# Patient Record
Sex: Male | Born: 1935 | Race: White | Hispanic: No | Marital: Married | Smoking: Never smoker
Health system: Southern US, Community
[De-identification: ages and names within clinical notes are randomized; demographics above are authoritative.]

## PROBLEM LIST (undated history)

## (undated) DIAGNOSIS — C801 Malignant (primary) neoplasm, unspecified: Secondary | ICD-10-CM

## (undated) DIAGNOSIS — F0390 Unspecified dementia without behavioral disturbance: Secondary | ICD-10-CM

## (undated) DIAGNOSIS — I10 Essential (primary) hypertension: Secondary | ICD-10-CM

---

## 2004-08-10 ENCOUNTER — Ambulatory Visit: Payer: Self-pay | Admitting: Internal Medicine

## 2004-09-06 ENCOUNTER — Ambulatory Visit: Payer: Self-pay | Admitting: Internal Medicine

## 2004-10-12 ENCOUNTER — Ambulatory Visit: Payer: Self-pay | Admitting: Internal Medicine

## 2004-11-04 ENCOUNTER — Ambulatory Visit: Payer: Self-pay | Admitting: Internal Medicine

## 2007-07-18 ENCOUNTER — Ambulatory Visit: Payer: Self-pay | Admitting: Vascular Surgery

## 2007-07-23 ENCOUNTER — Other Ambulatory Visit: Payer: Self-pay

## 2007-07-23 ENCOUNTER — Ambulatory Visit: Payer: Self-pay | Admitting: Vascular Surgery

## 2007-07-28 ENCOUNTER — Inpatient Hospital Stay: Payer: Self-pay | Admitting: Vascular Surgery

## 2007-09-12 ENCOUNTER — Ambulatory Visit: Payer: Self-pay | Admitting: Vascular Surgery

## 2009-02-25 ENCOUNTER — Ambulatory Visit: Payer: Self-pay | Admitting: Internal Medicine

## 2011-05-29 ENCOUNTER — Ambulatory Visit: Payer: Self-pay | Admitting: Vascular Surgery

## 2011-08-16 ENCOUNTER — Ambulatory Visit: Payer: Self-pay | Admitting: Vascular Surgery

## 2011-08-16 LAB — CBC
HCT: 43.4 % (ref 40.0–52.0)
HGB: 14.6 g/dL (ref 13.0–18.0)
MCHC: 33.6 g/dL (ref 32.0–36.0)
MCV: 92 fL (ref 80–100)
Platelet: 241 10*3/uL (ref 150–440)
RBC: 4.72 10*6/uL (ref 4.40–5.90)
WBC: 6.6 10*3/uL (ref 3.8–10.6)

## 2011-08-16 LAB — BASIC METABOLIC PANEL
Anion Gap: 6 — ABNORMAL LOW (ref 7–16)
Calcium, Total: 9.2 mg/dL (ref 8.5–10.1)
Co2: 31 mmol/L (ref 21–32)
Creatinine: 0.97 mg/dL (ref 0.60–1.30)
EGFR (African American): >60
Sodium: 141 mmol/L (ref 136–145)

## 2011-08-28 ENCOUNTER — Ambulatory Visit: Payer: Self-pay | Admitting: Cardiology

## 2011-08-29 LAB — BASIC METABOLIC PANEL
Anion Gap: 11 (ref 7–16)
Calcium, Total: 8.4 mg/dL — ABNORMAL LOW (ref 8.5–10.1)
Co2: 28 mmol/L (ref 21–32)
Creatinine: 0.85 mg/dL (ref 0.60–1.30)
EGFR (African American): >60
Glucose: 128 mg/dL — ABNORMAL HIGH (ref 65–99)

## 2011-09-26 ENCOUNTER — Encounter: Payer: Self-pay | Admitting: Cardiology

## 2011-10-05 ENCOUNTER — Encounter: Payer: Self-pay | Admitting: Cardiology

## 2011-11-05 ENCOUNTER — Encounter: Payer: Self-pay | Admitting: Cardiology

## 2012-03-28 ENCOUNTER — Emergency Department: Payer: Self-pay | Admitting: Emergency Medicine

## 2012-03-28 LAB — BASIC METABOLIC PANEL
Anion Gap: 9 (ref 7–16)
BUN: 20 mg/dL — ABNORMAL HIGH (ref 7–18)
Chloride: 102 mmol/L (ref 98–107)
Creatinine: 0.89 mg/dL (ref 0.60–1.30)
EGFR (African American): >60
Glucose: 107 mg/dL — ABNORMAL HIGH (ref 65–99)
Osmolality: 282 (ref 275–301)

## 2012-03-28 LAB — CBC WITH DIFFERENTIAL/PLATELET
Eosinophil #: 0.1 10*3/uL (ref 0.0–0.7)
Eosinophil %: 1.2 %
HCT: 42.4 % (ref 40.0–52.0)
HGB: 14.1 g/dL (ref 13.0–18.0)
Lymphocyte %: 28.3 %
MCHC: 33.2 g/dL (ref 32.0–36.0)
Monocyte #: 0.6 x10 3/mm (ref 0.2–1.0)
Monocyte %: 8.4 %
Neutrophil %: 61.3 %
Platelet: 206 10*3/uL (ref 150–440)
RBC: 4.68 10*6/uL (ref 4.40–5.90)
WBC: 7.4 10*3/uL (ref 3.8–10.6)

## 2012-08-28 ENCOUNTER — Ambulatory Visit: Payer: Self-pay | Admitting: Internal Medicine

## 2013-03-27 ENCOUNTER — Emergency Department: Payer: Self-pay | Admitting: Unknown Physician Specialty

## 2013-03-27 LAB — CBC
HGB: 14.1 g/dL (ref 13.0–18.0)
MCH: 30.9 pg (ref 26.0–34.0)
MCV: 92 fL (ref 80–100)
Platelet: 228 10*3/uL (ref 150–440)
RDW: 13.7 % (ref 11.5–14.5)
WBC: 8.5 10*3/uL (ref 3.8–10.6)

## 2013-03-27 LAB — COMPREHENSIVE METABOLIC PANEL
Albumin: 4.2 g/dL (ref 3.4–5.0)
Anion Gap: 1 — ABNORMAL LOW (ref 7–16)
Bilirubin,Total: 0.7 mg/dL (ref 0.2–1.0)
Calcium, Total: 9.1 mg/dL (ref 8.5–10.1)
Co2: 32 mmol/L (ref 21–32)
EGFR (Non-African Amer.): >60
Glucose: 117 mg/dL — ABNORMAL HIGH (ref 65–99)
Osmolality: 271 (ref 275–301)
Potassium: 4.1 mmol/L (ref 3.5–5.1)
SGOT(AST): 24 U/L (ref 15–37)
SGPT (ALT): 34 U/L (ref 12–78)
Sodium: 134 mmol/L — ABNORMAL LOW (ref 136–145)
Total Protein: 7.5 g/dL (ref 6.4–8.2)

## 2013-03-27 LAB — TROPONIN I: Troponin-I: <0.02 ng/mL

## 2013-03-27 LAB — MAGNESIUM: Magnesium: 2 mg/dL

## 2013-04-21 ENCOUNTER — Ambulatory Visit: Payer: Self-pay | Admitting: Cardiology

## 2013-12-11 LAB — CBC
HCT: 42.3 % (ref 40.0–52.0)
HGB: 13.8 g/dL (ref 13.0–18.0)
MCH: 29.9 pg (ref 26.0–34.0)
MCHC: 32.7 g/dL (ref 32.0–36.0)
MCV: 91 fL (ref 80–100)
PLATELETS: 224 10*3/uL (ref 150–440)
RBC: 4.63 10*6/uL (ref 4.40–5.90)
RDW: 13.6 % (ref 11.5–14.5)
WBC: 11 10*3/uL — AB (ref 3.8–10.6)

## 2013-12-11 LAB — BASIC METABOLIC PANEL
ANION GAP: 10 (ref 7–16)
BUN: 33 mg/dL — AB (ref 7–18)
CALCIUM: 9.4 mg/dL (ref 8.5–10.1)
CO2: 23 mmol/L (ref 21–32)
Chloride: 103 mmol/L (ref 98–107)
Creatinine: 1.96 mg/dL — ABNORMAL HIGH (ref 0.60–1.30)
EGFR (African American): 36 — ABNORMAL LOW
GFR CALC NON AF AMER: 31 — AB
GLUCOSE: 125 mg/dL — AB (ref 65–99)
Osmolality: 281 (ref 275–301)
POTASSIUM: 3.9 mmol/L (ref 3.5–5.1)
Sodium: 136 mmol/L (ref 136–145)

## 2013-12-11 LAB — CK TOTAL AND CKMB (NOT AT ARMC)
CK, Total: 74 U/L
CK-MB: 0.6 ng/mL (ref 0.5–3.6)

## 2013-12-11 LAB — TROPONIN I

## 2013-12-12 ENCOUNTER — Inpatient Hospital Stay: Payer: Self-pay | Admitting: Student

## 2013-12-12 LAB — BASIC METABOLIC PANEL
ANION GAP: 6 — AB (ref 7–16)
BUN: 28 mg/dL — AB (ref 7–18)
CALCIUM: 8.8 mg/dL (ref 8.5–10.1)
CHLORIDE: 104 mmol/L (ref 98–107)
CO2: 28 mmol/L (ref 21–32)
Creatinine: 1.08 mg/dL (ref 0.60–1.30)
EGFR (African American): >60
GLUCOSE: 104 mg/dL — AB (ref 65–99)
OSMOLALITY: 281 (ref 275–301)
Potassium: 3.5 mmol/L (ref 3.5–5.1)
Sodium: 138 mmol/L (ref 136–145)

## 2013-12-12 LAB — URINALYSIS, COMPLETE
Bilirubin,UR: NEGATIVE
Blood: NEGATIVE
Glucose,UR: NEGATIVE mg/dL (ref 0–75)
LEUKOCYTE ESTERASE: NEGATIVE
Nitrite: NEGATIVE
PROTEIN: NEGATIVE
Ph: 5 (ref 4.5–8.0)
SPECIFIC GRAVITY: 1.015 (ref 1.003–1.030)
Squamous Epithelial: <1

## 2013-12-12 LAB — SODIUM, URINE, RANDOM: Sodium, Urine Random: 131 mmol/L (ref 20–110)

## 2013-12-12 LAB — TSH: THYROID STIMULATING HORM: 2.94 u[IU]/mL

## 2013-12-12 LAB — CREATININE, URINE, RANDOM: CREATININE, URINE RANDOM: 96.9 mg/dL (ref 30.0–125.0)

## 2014-05-18 ENCOUNTER — Ambulatory Visit (INDEPENDENT_AMBULATORY_CARE_PROVIDER_SITE_OTHER): Payer: Medicare Other | Admitting: Podiatry

## 2014-05-18 ENCOUNTER — Encounter: Payer: Self-pay | Admitting: Podiatry

## 2014-05-18 VITALS — BP 155/82 | HR 70 | Resp 16 | Ht 71.0 in | Wt 215.0 lb

## 2014-05-18 DIAGNOSIS — B351 Tinea unguium: Secondary | ICD-10-CM

## 2014-05-18 DIAGNOSIS — M79673 Pain in unspecified foot: Secondary | ICD-10-CM

## 2014-05-18 DIAGNOSIS — M205X9 Other deformities of toe(s) (acquired), unspecified foot: Secondary | ICD-10-CM

## 2014-05-18 DIAGNOSIS — M21629 Bunionette of unspecified foot: Secondary | ICD-10-CM

## 2014-05-18 DIAGNOSIS — Q828 Other specified congenital malformations of skin: Secondary | ICD-10-CM

## 2014-05-18 MED ORDER — TAVABOROLE 5 % EX SOLN: 1.0000 [drp] | CUTANEOUS | Status: DC

## 2014-05-18 NOTE — Patient Instructions (Signed)

## 2014-05-19 ENCOUNTER — Encounter: Payer: Self-pay | Admitting: Podiatry

## 2014-05-19 NOTE — Progress Notes (Signed)
Patient ID: Charles Velasquez, male   DOB: [birth year], 80 y.o.   MRN: [id number]  Subjective: 80 year old male presents the office today for complaints of bilateral big toe nail thickening as well as a history of painful areas on the outside aspect of his feet bilaterally. Patient is accompanied today with his daughter. Patient's daughter states that previously he had 2 areas on the outside of his feet which became callus-like and appear to have some fluid underneath it. At that time she states that it was red and very tender on both feet. Since then patient's purchase new shoes which has seemed to help alleviate the pressure off of the symptomatic areas. Today they're inquiring about possible nail fungus, shoe gear, weighs to prevent recurrence the symptomatic areas. Denies a history of trauma or injury to the area. No other complaints at this time.  Objective: AAO x3, NAD DP/PT pulses palpable bilaterally, CRT less than 3 seconds Protective sensation intact with Simms Weinstein monofilament, Achilles tendon reflex intact Bilateral hallux nails hypertrophic, dystrophic, elongated, brittle, yellow discoloration. No swelling erythema or drainage. Bilateral tailors bunion deformity.  Hyperkeratotic lesion right foot lateral aspect on the fifth metatarsal head. Subjectively the patient's daughter states this is the area on both feet that previously had some fluid with callus like skin and are very painful. Today there is no overlying erythema, drainage, open wounds. There is tenderness to palpation directly over the hyperkeratotic lesion on the right foot. There is no pinpoint bony tenderness or pain with vibratory sensation. MMT 5/5, ROM WNL No calf pain, swelling, warmth.  Assessment: 80 year old male with onychomycosis bilateral hallux, prominent fifth metatarsal head with a right hyperkeratotic lesion overlying.  Plan: -Various treatment options were discussed the patient including alternatives,  risks, complications. -Hyperkeratotic lesion right foot sharply debrided without complications. -Discussed with the patient and his daughter that patient most likely benefit from a wider shoe given his tailors bunion deformity. Patient's daughter states that he previously was wearing are shoes. Also recommend offloading pads to the fifth metatarsal heads bilaterally to help alleviate the pressure. -For bilateral hallux nails discuss this is most likely onychomycosis due to the thickening, brittleness, yellow discoloration. They're inquiring about. Treatment options. I discussed with them different options including risks and complications. They've elected to proceed with [facility] for treatment. Side effects discussed and directed to stop the medication immediately if any are to occur call the office. A prescription for this was sent to [facility] and they were given information to followup with them. -Followup as needed. Call the office with any questions, concerns, change in symptoms. Discussed with the patient I can periodically debrided his calluses and  Symptomatic hallux nails.

## 2014-10-21 ENCOUNTER — Ambulatory Visit (INDEPENDENT_AMBULATORY_CARE_PROVIDER_SITE_OTHER): Payer: Medicare Other | Admitting: Podiatry

## 2014-10-21 DIAGNOSIS — B351 Tinea unguium: Secondary | ICD-10-CM

## 2014-10-21 DIAGNOSIS — Q828 Other specified congenital malformations of skin: Secondary | ICD-10-CM

## 2014-10-21 DIAGNOSIS — M79676 Pain in unspecified toe(s): Secondary | ICD-10-CM

## 2014-10-21 NOTE — Progress Notes (Signed)
Patient ID: Charles Velasquez, male   DOB: 1928-02-01, 81 y.o.   MRN: [id number]  Subjective: 81 y.o.-year-old male returns the office today for painful, elongated, thickened toenails. Denies any redness or drainage around the nails. He states he is unable to trim the nails himself. Also states he is a painful callus on the side of the right foot. Denies any redness or drainage from the site. He has since stopped using the [place] as he has run out. Denies any acute changes since last appointment and no new complaints today. Denies any systemic complaints such as fevers, chills, nausea, vomiting.   Objective: AAO 3, NAD DP/PT pulses palpable, CRT less than 3 seconds Protective sensation intact with Simms Weinstein monofilament, Achilles tendon reflex intact.  Nails hypertrophic, dystrophic, elongated, brittle, discolored 10. There is tenderness overlying the nails 1-5 bilaterally. There is no surrounding erythema or drainage along the nail sites. No open lesions bilaterally. There is a hyperkeratotic lesion on the lateral aspect of the right fifth metatarsal head. Upon debridement no underlying ulceration, drainage or other clinical signs of infection. No other areas of tenderness bilateral lower extremities. No overlying edema, erythema, increased warmth. No pain with calf compression, swelling, warmth, erythema.  Assessment: Patient presents with symptomatic onychomycosis  Plan: -Treatment options including alternatives, risks, complications were discussed -Nails sharply debrided 10 without complication/bleeding. -Hyperkeratotic lesion sharply debrided 1 without complications/bleeding. -I discussed to continue using Kerydin. There unsure if they want to continue with that. Discussed with him that if he wants treatment to call the office and I will refill the Kerydin. -Discussed daily foot inspection. If there are any changes, to call the office immediately.  -Follow-up in 3 months or sooner  if any problems are to arise. In the meantime, encouraged to call the office with any questions, concerns, changes symptoms.

## 2014-11-27 ENCOUNTER — Emergency Department
Admit: 2014-11-27 | Disposition: A | Discharge status: Home / Self Care | Payer: Self-pay | Admitting: Emergency Medicine

## 2014-11-27 LAB — CBC WITH DIFFERENTIAL/PLATELET
BASOS ABS: 0.1 10*3/uL (ref 0.0–0.1)
BASOS PCT: 0.7 %
EOS ABS: 0.1 10*3/uL (ref 0.0–0.7)
Eosinophil %: 0.8 %
HCT: 43.5 % (ref 40.0–52.0)
HGB: 14.5 g/dL (ref 13.0–18.0)
LYMPHS PCT: 15.4 %
Lymphocyte #: 1.5 10*3/uL (ref 1.0–3.6)
MCH: 29.9 pg (ref 26.0–34.0)
MCHC: 33.4 g/dL (ref 32.0–36.0)
MCV: 90 fL (ref 80–100)
Monocyte #: 0.6 x10 3/mm (ref 0.2–1.0)
Monocyte %: 6.6 %
NEUTROS ABS: 7.4 10*3/uL — AB (ref 1.4–6.5)
NEUTROS PCT: 76.5 %
Platelet: 247 10*3/uL (ref 150–440)
RBC: 4.85 10*6/uL (ref 4.40–5.90)
RDW: 13.5 % (ref 11.5–14.5)
WBC: 9.7 10*3/uL (ref 3.8–10.6)

## 2014-11-27 LAB — SEDIMENTATION RATE: ERYTHROCYTE SED RATE: 7 mm/h (ref 0–20)

## 2014-11-27 NOTE — Discharge Summary (Signed)
PATIENT NAME:  Charles Velasquez, Charles Velasquez MR#:  [id number] DATE OF BIRTH:  1927/12/11  DATE OF ADMISSION:  12/12/2013 DATE OF DISCHARGE:  12/12/2013  PRIMARY CARE PHYSICIAN: Dr. Baldemar Lenis.   CHIEF COMPLAINT: Presyncope.   DISCHARGE DIAGNOSES:  1. Acute kidney injury resolved.  2. Presyncope, likely situational.  3. Hypertension. 4. Gastroesophageal reflux disease.  5. History of coronary artery disease status post stent.  6. Hyperlipidemia.  7. Peripheral vascular disease.  8. History of transient ischemic attack.   DISCHARGE MEDICATIONS: Niaspan extended-release 1000 mg at bedtime, diltiazem 240 mg extended-release once a day, clopidogrel 75 mg daily, Prilosec OTC 20 mg once a day, atorvastatin 40 mg once a day, aspirin 81 mg once a day, hydrochlorothiazide/lisinopril 25/20 mg once a day.   DIET: Low sodium, low fat, low cholesterol.   ACTIVITY: As tolerated.   FOLLOWUP: Please follow with PCP for a BMP check and a blood pressure check within a week.   DISPOSITION: Home.   CODE STATUS: The patient is full code.   SIGNIFICANT LABS AND IMAGING: Initial BUN 33, creatinine 1.96, last creatinine of 1.0. TSH of 2.94. Troponin negative on admission. White count 11. UA did not suggest an infection.   HISTORY OF PRESENT ILLNESS AND HOSPITAL COURSE: For full details of H and P, please see the dictation on 05/09 by Dr. Lavetta Nielsen, but briefly, this is a pleasant 81 year old with history of CAD, hypertension, who was doing well. He was doing lawn work for about 3 hours yesterday and, per family and patient was keeping himself hydrated with Diet Cokes. He then had an event and attended at [facility] for about an hour where he was standing and felt flushed, lightheaded, and had a near syncopal episode. There was no loss of consciousness. He came into the hospital and was noted to have acute kidney injury with elevated BUN and creatinine. His blood pressure was stable. He was started on some IV fluids and  admitted to the hospitalist service. His lisinopril/hydrochlorothiazide was held. With IV hydration he felt significantly improved. His blood pressure actually was on the higher side, and he is not hypertensive nor does he have any significant orthostatic hypotension. I suspect his dizziness and presyncope is likely situational doing lawn work for multiple hours on a hot day and then standing for about an hour. He did develop an acute kidney injury which has resolved with IV fluids. He will need to follow up with his PCP for a BMP check within a week, and also check his blood pressure. At this point, he has no significant shortness of breath, chest pains, or palpitations.   PHYSICAL EXAMINATION:  VITAL SIGNS: On the day of discharge, his temperature is 98.5, pulse rate 64, respiratory rate 18, blood pressure 152/78, oxygen saturation 94%.  GENERAL: The patient well-developed male sitting in bed, no obvious distress.  HEENT: Normocephalic, atraumatic, moist mucous membranes.  HEART: Normal S1, S2.  LUNGS: Clear.  ABDOMEN: Soft, nontender.   TOTAL TIME SPENT: About 35 minutes.     ____________________________ Vivien Presto, MD sa:lt D: 12/12/2013 13:03:41 ET T: 12/12/2013 22:29:34 ET JOB#: [id number]  cc: Vivien Presto, MD, <Dictator> Derinda Late, MD Vivien Presto MD ELECTRONICALLY SIGNED 12/29/2013 18:29

## 2014-11-27 NOTE — H&P (Signed)
PATIENT NAME:  Charles Velasquez, Charles Velasquez MR#:  [id number] DATE OF BIRTH:  1927-09-28  DATE OF ADMISSION:  12/12/2013  REFERRING PHYSICIAN: Dr. Jacqualine Code.  PRIMARY CARE PHYSICIAN: Dr. Baldemar Lenis.  CHIEF COMPLAINT: Nearly passed out.  HISTORY OF PRESENT ILLNESS: An 81 year old history Caucasian male with a history of coronary artery disease, hypertension, presenting after a near-syncopal episode. He was in the usual state of health at an event at [facility] for about 3 hours outside, felt flushed as well as lightheaded and had near syncopal event. No loss of consciousness. No head trauma. He lay down, someone put a cold, wet towel on him. Symptoms improved. Denies any palpitations, chest pain, shortness of breath, or any further symptomatology. He is back to his usual state of health now. He denies any recent decrease in p.o. intake any nausea, vomiting, or any other changes.   REVIEW OF SYSTEMS:  CONSTITUTIONAL: Denies fever, fatigue, weakness. EYES: Denies blurry, double vision, or eye pain.  HEENT: Denies tinnitus, ear pain, hearing loss.  RESPIRATORY: Denies cough, wheeze, shortness of breath. CARDIOVASCULAR: Denies chest pain, palpitations, edema. GASTROINTESTINAL: Positive for nausea, denies vomiting, diarrhea, abdominal pain.  GENITOURINARY: Denies dysuria or hematuria.  ENDOCRINE: Denies nocturia or thyroid problems.  HEMATOLOGIC AND LYMPHATIC: Denies easy bruising, bleeding. SKIN: Denies rash or lesions. MUSCULOSKELETAL: Denies pain in neck, back, shoulders, knees, hips, or arthritic symptoms.  NEUROLOGIC: He denies paralysis or paresthesias.  PSYCHIATRIC: Denies anxiety or depressive symptoms.   Otherwise, full review of systems performed and is negative.   PAST MEDICAL HISTORY: Coronary artery disease status post PCI with stent placement, hyperlipidemia, peripheral vascular disease, gastroesophageal reflux disease, history of TIA, hypertension.   SOCIAL HISTORY: Remote tobacco use.  Occasional alcohol use. Denies any drug usage.   FAMILY HISTORY: No known cardiovascular or pulmonary illnesses.  ALLERGIES: VIAGRA.   HOME MEDICATIONS: Include aspirin 81 mg p.o. daily, diltiazem 240 mg p.o. daily, atorvastatin 40 mg p.o. daily, Niaspan extended release 1000 mg p.o. at bedtime, hydrochlorothiazide/lisinopril 25/20 mg p.o. daily, Plavix 75 mg p.o. at bedtime, Prilosec 20 mg p.o. daily.   PHYSICAL EXAMINATION:  VITAL SIGNS: Temperature 97.7, heart rate 66, respirations 21, blood pressure 139/54, saturating 95% on room air.  GENERAL: Well-nourished, well-developed Caucasian male currently in no acute distress.  HEAD: Normocephalic, atraumatic.  EYES: Pupils equal, round, reactive to light. Extraocular muscles intact. No scleral icterus.  MOUTH: Moist mucous membranes.  Dentition intake. No abscess noted.  EARS, NOSE, AND THROAT: Clear without exudates. No external lesions.  NECK: Supple. No thyromegaly. No nodules. No JVD.  PULMONARY: Clear to auscultation bilaterally without wheezes, rales, or rhonchi. No use of accessory muscles. Good respiratory effort.  CHEST:  Nontender on palpation.  CARDIOVASCULAR: S1, S2, regular rate and rhythm with a 3/6 systolic ejection murmur best heard at right upper sternal border. No edema. Pedal pulses 2+ bilaterally.  GASTROINTESTINAL: Soft, nontender, nondistended. No masses. Positive bowel sounds. No hepatosplenomegaly.  MUSCULOSKELETAL: No swelling, clubbing, or edema. Range of motion is full in all extremities. NEUROLOGIC: Cranial nerves II-XII intact. No gross neurologic deficits. Sensation intact. Reflexes intact.  SKIN: No ulcerations, lesions, no rashes, or cyanosis. Skin warm and dry. Turgor intact. PSYCHIATRIC: Mood and affect within normal limits. The patient alert and oriented x 3. Insight and judgment intact.   LABORATORY DATA: EKG revealing first-degree AV block with PR interval of 246 otherwise within normal limits.   The  remainder of laboratory data: Sodium 136, potassium 3.9, chloride 103, bicarbonate 23, BUN 33,  creatinine 1.96 with last known baseline around 0.8, glucose 125, troponin 0.02. WBC 11.   ASSESSMENT AND PLAN: An 81 year old gentleman with history of coronary artery disease, hypertension, after a near-syncopal episode, found to have acute kidney injury.  1. Acute kidney injury of unclear etiology. Will check urinalysis, urine creatinine, as well as urea to calculate FE Urea. Hold hydrochlorothiazide and lisinopril. Provide intravenous fluid hydration. Follow urine output and renal function. We will also check a TSH.  2. Presyncope. Admit to telemetry. Check orthostatic vital signs although he has already received fluids in the emergency department. Continue IV fluid hydration.  3. Hypertension. Hold hydrochlorothiazide and lisinopril. Continue other home medications.  4. Gastroesophageal reflux disease on proton pump inhibitor therapy. 5. Venous thromboembolism prophylaxis with heparin subcutaneous.   CODE STATUS: The patient is a full code.  TIME SPENT: 45 minutes.    ____________________________ Aaron Mose. Taylor Levick, MD dkh:lt D: 12/12/2013 01:02:26 ET T: 12/12/2013 05:01:58 ET JOB#: [id number]  cc: Aaron Mose. Geremiah Fussell, MD, <Dictator> Roosevelt Bisher Woodfin Ganja MD ELECTRONICALLY SIGNED 12/12/2013 20:49

## 2015-01-20 ENCOUNTER — Ambulatory Visit (INDEPENDENT_AMBULATORY_CARE_PROVIDER_SITE_OTHER): Payer: Medicare Other | Admitting: Podiatry

## 2015-01-20 DIAGNOSIS — B351 Tinea unguium: Secondary | ICD-10-CM | POA: Diagnosis not present

## 2015-01-20 DIAGNOSIS — Q828 Other specified congenital malformations of skin: Secondary | ICD-10-CM

## 2015-01-20 DIAGNOSIS — M79676 Pain in unspecified toe(s): Secondary | ICD-10-CM | POA: Diagnosis not present

## 2015-01-20 NOTE — Progress Notes (Signed)
Patient ID: Charles Velasquez, male   DOB: 1927-12-22, 81 y.o.   MRN: [id number]  Subjective: 81 y.o.-year-old male returns the office today for painful, elongated, thickened toenails which he is unable to trim himself. Denies any redness or drainage around the nails. Denies any redness or drainage from the site.  Denies any acute changes since last appointment and no new complaints today. Denies any systemic complaints such as fevers, chills, nausea, vomiting.   Objective: AAO 3, NAD DP/PT pulses palpable, CRT less than 3 seconds Protective sensation intact with Simms Weinstein monofilament, Achilles tendon reflex intact.  Nails hypertrophic, dystrophic, elongated, brittle, discolored 10. There is tenderness overlying the nails 1-5 bilaterally. There is no surrounding erythema or drainage along the nail sites. No open lesions bilaterally. There is a hyperkeratotic lesion on the lateral aspect of the right fifth metatarsal head bilaterally. Upon debridement no underlying ulceration, drainage or other clinical signs of infection. Tailor's bunion bilaterally No other areas of tenderness bilateral lower extremities. No overlying edema, erythema, increased warmth. No pain with calf compression, swelling, warmth, erythema.  Assessment: Patient presents with symptomatic onychomycosis; hyperkerotic lesions Plan: -Treatment options including alternatives, risks, complications were discussed -Nails sharply debrided 10 without complication/bleeding. -Hyperkeratotic lesion sharply debrided 2 without complications/bleeding. -Discussed daily foot inspection. If there are any changes, to call the office immediately.  -Follow-up in 3 months or sooner if any problems are to arise. In the meantime, encouraged to call the office with any questions, concerns, changes symptoms.

## 2015-04-21 ENCOUNTER — Ambulatory Visit (INDEPENDENT_AMBULATORY_CARE_PROVIDER_SITE_OTHER): Payer: Medicare Other | Admitting: Podiatry

## 2015-04-21 DIAGNOSIS — M79676 Pain in unspecified toe(s): Secondary | ICD-10-CM | POA: Diagnosis not present

## 2015-04-21 DIAGNOSIS — B351 Tinea unguium: Secondary | ICD-10-CM | POA: Diagnosis not present

## 2015-04-21 DIAGNOSIS — Q828 Other specified congenital malformations of skin: Secondary | ICD-10-CM

## 2015-04-21 NOTE — Progress Notes (Signed)
Patient ID: Charles Velasquez, male   DOB: Oct 13, 1927, 81 y.o.   MRN: [id number]  Subjective: 81 y.o.-year-old male returns the office today for painful, elongated, thickened toenails which he is unable to trim himself. Denies any redness or drainage around the nails. Denies any redness or drainage from the site.  Denies any acute changes since last appointment and no new complaints today. Denies any systemic complaints such as fevers, chills, nausea, vomiting.   Objective: AAO 3, NAD DP/PT pulses palpable, CRT less than 3 seconds Nails hypertrophic, dystrophic, elongated, brittle, discolored 10. There is tenderness overlying the nails 1-5 bilaterally. There is no surrounding erythema or drainage along the nail sites. No open lesions bilaterally. There is a hyperkeratotic lesion on the lateral aspect of the right fifth metatarsal head bilaterally. Upon debridement no underlying ulceration, drainage or other clinical signs of infection. Tailor's bunion bilaterally No other areas of tenderness bilateral lower extremities. No overlying edema, erythema, increased warmth. No pain with calf compression, swelling, warmth, erythema.  Assessment: Patient presents with symptomatic onychomycosis; hyperkerotic lesion R lat. 5th MPJ  Plan: -Treatment options including alternatives, risks, complications were discussed -Nails sharply debrided 10 without complication/bleeding. -Hyperkeratotic lesion sharply debrided x 1 without complications/bleeding. Offloading pads dispensed -Discussed daily foot inspection. If there are any changes, to call the office immediately.  -Follow-up in 3 months or sooner if any problems are to arise. In the meantime, encouraged to call the office with any questions, concerns, changes symptoms.  Celesta Gentile, DPM

## 2015-07-21 ENCOUNTER — Ambulatory Visit: Payer: Medicare Other

## 2015-07-22 ENCOUNTER — Ambulatory Visit: Payer: Medicare Other | Admitting: Sports Medicine

## 2016-06-27 ENCOUNTER — Other Ambulatory Visit: Payer: Self-pay | Admitting: Ophthalmology

## 2016-06-27 ENCOUNTER — Ambulatory Visit
Admission: RE | Admit: 2016-06-27 | Discharge: 2016-06-27 | Disposition: A | Discharge status: Home / Self Care | Payer: Medicare Other | Source: Ambulatory Visit | Attending: Ophthalmology | Admitting: Ophthalmology

## 2016-06-27 DIAGNOSIS — H4900 Third [oculomotor] nerve palsy, unspecified eye: Secondary | ICD-10-CM

## 2016-06-27 DIAGNOSIS — I672 Cerebral atherosclerosis: Secondary | ICD-10-CM | POA: Diagnosis not present

## 2016-06-27 HISTORY — DX: Malignant (primary) neoplasm, unspecified: C80.1

## 2016-06-27 HISTORY — DX: Essential (primary) hypertension: I10

## 2016-06-27 LAB — POCT I-STAT CREATININE: Creatinine, Ser: 1.1 mg/dL (ref 0.61–1.24)

## 2016-06-27 MED ORDER — IOPAMIDOL (ISOVUE-370) INJECTION 76%
75.0000 mL | Freq: Once | INTRAVENOUS | Status: AC | PRN
  Administered 2016-06-27: 75 mL via INTRAVENOUS

## 2016-07-05 ENCOUNTER — Other Ambulatory Visit
Admission: RE | Admit: 2016-07-05 | Discharge: 2016-07-05 | Disposition: A | Discharge status: Home / Self Care | Payer: Medicare Other | Source: Ambulatory Visit | Attending: Ophthalmology | Admitting: Ophthalmology

## 2016-07-05 DIAGNOSIS — H4900 Third [oculomotor] nerve palsy, unspecified eye: Secondary | ICD-10-CM | POA: Insufficient documentation

## 2016-07-05 LAB — C-REACTIVE PROTEIN

## 2016-07-05 LAB — SEDIMENTATION RATE: SED RATE: 8 mm/h (ref 0–20)

## 2016-07-12 ENCOUNTER — Other Ambulatory Visit
Admission: RE | Admit: 2016-07-12 | Discharge: 2016-07-12 | Disposition: A | Discharge status: Home / Self Care | Payer: Medicare Other | Source: Ambulatory Visit | Attending: Ophthalmology | Admitting: Ophthalmology

## 2016-07-12 DIAGNOSIS — H4900 Third [oculomotor] nerve palsy, unspecified eye: Secondary | ICD-10-CM | POA: Diagnosis present

## 2016-07-13 LAB — MISC LABCORP TEST (SEND OUT): LABCORP TEST CODE: 86007

## 2016-07-24 LAB — ACETYLCHOLINE RECEPTOR AB, ALL
ACETYLCHOL BLOCK AB: 21 % (ref 0–25)
Acetylcholine Modulat Ab: <12 % (ref 0–20)

## 2017-03-01 ENCOUNTER — Encounter (INDEPENDENT_AMBULATORY_CARE_PROVIDER_SITE_OTHER): Payer: Medicare Other

## 2017-03-01 ENCOUNTER — Ambulatory Visit (INDEPENDENT_AMBULATORY_CARE_PROVIDER_SITE_OTHER): Payer: Medicare Other | Admitting: Vascular Surgery

## 2017-03-04 ENCOUNTER — Other Ambulatory Visit (INDEPENDENT_AMBULATORY_CARE_PROVIDER_SITE_OTHER): Payer: Self-pay | Admitting: Vascular Surgery

## 2017-03-04 DIAGNOSIS — I6523 Occlusion and stenosis of bilateral carotid arteries: Secondary | ICD-10-CM

## 2017-03-04 DIAGNOSIS — I739 Peripheral vascular disease, unspecified: Secondary | ICD-10-CM

## 2017-03-05 ENCOUNTER — Ambulatory Visit (INDEPENDENT_AMBULATORY_CARE_PROVIDER_SITE_OTHER): Payer: Medicare Other

## 2017-03-05 ENCOUNTER — Ambulatory Visit (INDEPENDENT_AMBULATORY_CARE_PROVIDER_SITE_OTHER): Payer: Medicare Other | Admitting: Vascular Surgery

## 2017-03-05 VITALS — BP 201/89 | HR 68 | Resp 16 | Wt 213.0 lb

## 2017-03-05 DIAGNOSIS — I739 Peripheral vascular disease, unspecified: Secondary | ICD-10-CM | POA: Diagnosis not present

## 2017-03-05 DIAGNOSIS — I10 Essential (primary) hypertension: Secondary | ICD-10-CM | POA: Diagnosis not present

## 2017-03-05 DIAGNOSIS — I6529 Occlusion and stenosis of unspecified carotid artery: Secondary | ICD-10-CM | POA: Insufficient documentation

## 2017-03-05 DIAGNOSIS — I6523 Occlusion and stenosis of bilateral carotid arteries: Secondary | ICD-10-CM

## 2017-03-05 NOTE — Assessment & Plan Note (Signed)
His duplex shows a patent left carotid endarterectomy and an 80-99% right ICA stenosis which is similar to his previous studies. His wife is adamant that no intervention will be performed for this and I would not recommend that given his age, asymptomatic status, and advancing dementia. At this point, we will recheck this as needed

## 2017-03-05 NOTE — Patient Instructions (Signed)

## 2017-03-05 NOTE — Assessment & Plan Note (Signed)
blood pressure control important in reducing the progression of atherosclerotic disease. On appropriate oral medications.  

## 2017-03-05 NOTE — Assessment & Plan Note (Signed)
His ABIs today are stable at 0.72 on the right and 0.66 on the left. These are not significantly changed from his study one year ago. No limb threatening symptoms. At the request of his wife, we will check this as needed at this point. Continue Plavix and statin agent.

## 2017-03-05 NOTE — Progress Notes (Signed)
MRN : [id number]  Charles Velasquez is a 83 y.o. (1928/05/26) male who presents with chief complaint of  Chief Complaint  Patient presents with  . ultrasound follow up  .  History of Present Illness: Patient returns in follow up of multiple vascular issues.  He is many years status post left carotid endarterectomy and has had known high-grade right carotid artery stenosis that we have followed medically given his advanced age and deteriorating dementia. He actually is more alert and looks well today. His duplex shows a patent left carotid endarterectomy and an 80-99% right ICA stenosis which is similar to his previous studies. His ABIs today are stable at 0.72 on the right and 0.66 on the left. These are not significantly changed from his study one year ago. He also had iliac intervention many years ago for lifestyle limiting claudication. He has no ulceration, infection, or ischemic rest pain.  Current Outpatient Prescriptions  Medication Sig Dispense Refill  . aspirin EC 81 MG tablet Take by mouth.    . cetirizine (ZYRTEC) 10 MG tablet TAKE 1 TABLET BY MOUTH DAILY    . citalopram (CELEXA) 20 MG tablet TAKE 1 TABLET BY MOUTH DAILY    . clopidogrel (PLAVIX) 75 MG tablet Take by mouth.    . diltiazem (DILACOR XR) 240 MG 24 hr capsule     . lisinopril (PRINIVIL,ZESTRIL) 20 MG tablet Take 20 mg by mouth daily.    Marland Kitchen atorvastatin (LIPITOR) 40 MG tablet TAKE 1 TABLET EVERY DAY    . niacin (NIASPAN) 1000 MG CR tablet TAKE ONE TABLET AT BEDTIME    . omeprazole (PRILOSEC) 40 MG capsule Take by mouth.    . Tavaborole (KERYDIN) 5 % SOLN Apply 1 drop topically 1 day or 1 dose. Apply 1 drop to the toenail daily. (Patient not taking: Reported on 03/05/2017) 1 Bottle 4   No current facility-administered medications for this visit.     Past Medical History:  Diagnosis Date  . Cancer ([place])   . Hypertension     Past Surgical History Left CEA Cardiac stent placement. Iliac stent placement.      Social History Social History  Substance Use Topics  . Smoking status: Never Smoker  . Smokeless tobacco: Not on file  . Alcohol use Yes  Married   Family History No bleeding or clotting disorders  No Known Allergies   REVIEW OF SYSTEMS (Negative unless checked)  Constitutional: [] Weight loss  [] Fever  [] Chills Cardiac: [] Chest pain   [] Chest pressure   [] Palpitations   [] Shortness of breath when laying flat   [] Shortness of breath at rest   [] Shortness of breath with exertion. Vascular:  [] Pain in legs with walking   [] Pain in legs at rest   [] Pain in legs when laying flat   [] Claudication   [] Pain in feet when walking  [] Pain in feet at rest  [] Pain in feet when laying flat   [] History of DVT   [] Phlebitis   [] Swelling in legs   [] Varicose veins   [] Non-healing ulcers Pulmonary:   [] Uses home oxygen   [] Productive cough   [] Hemoptysis   [] Wheeze  [] COPD   [] Asthma Neurologic:  [] Dizziness  [] Blackouts   [] Seizures   [] History of stroke   [] History of TIA  [] Aphasia   [] Temporary blindness   [] Dysphagia   [] Weakness or numbness in arms   [] Weakness or numbness in legs  X dementia positive Musculoskeletal:  [] Arthritis   [] Joint swelling   [] Joint pain   []   Low back pain Hematologic:  [] Easy bruising  [] Easy bleeding   [] Hypercoagulable state   [] Anemic  [] Hepatitis Gastrointestinal:  [] Blood in stool   [] Vomiting blood  [] Gastroesophageal reflux/heartburn   [] Difficulty swallowing. Genitourinary:  [] Chronic kidney disease   [] Difficult urination  [] Frequent urination  [] Burning with urination   [] Blood in urine Skin:  [] Rashes   [] Ulcers   [] Wounds Psychological:  [] History of anxiety   []  History of major depression.  Physical Examination  Vitals:   03/05/17 1431  BP: (!) 201/89  Pulse: 68  Resp: 16  Weight: 213 lb (96.6 kg)   Body mass index is 29.71 kg/m. Gen:  WD/WN, NAD Head: Green/AT, No temporalis wasting. Ear/Nose/Throat: Hearing grossly intact, nares w/o erythema  or drainage, trachea midline Eyes: Conjunctiva clear. Sclera non-icteric Neck: Supple.  No JVD. Right carotid bruit Pulmonary:  Good air movement, equal and clear to auscultation bilaterally.  Cardiac: RRR, normal S1, S2 Vascular:  Vessel Right Left  Radial Palpable Palpable                          PT 1+ Palpable 1+ Palpable  DP 1+ Palpable 1+ Palpable    Musculoskeletal: M/S 5/5 throughout.  No deformity or atrophy. Mild lower extremity edema. Neurologic: CN 2-12 intact. Sensation grossly intact in extremities.  Symmetrical.  Speech is fluent. Motor exam as listed above. Psychiatric: Judgment intact, Mood & affect appropriate for pt's clinical situation. Dermatologic: No rashes or ulcers noted.  No cellulitis or open wounds.      CBC Lab Results  Component Value Date   WBC 9.7 11/27/2014   HGB 14.5 11/27/2014   HCT 43.5 11/27/2014   MCV 90 11/27/2014   PLT 247 11/27/2014    BMET    Component Value Date/Time   NA 138 12/12/2013 0840   K 3.5 12/12/2013 0840   CL 104 12/12/2013 0840   CO2 28 12/12/2013 0840   GLUCOSE 104 (H) 12/12/2013 0840   BUN 28 (H) 12/12/2013 0840   CREATININE 1.10 06/27/2016 1338   CREATININE 1.08 12/12/2013 0840   CALCIUM 8.8 12/12/2013 0840   GFRNONAA >60 12/12/2013 0840   GFRAA >60 12/12/2013 0840   CrCl cannot be calculated (Patient's most recent lab result is older than the maximum 21 days allowed.).  COAG No results found for: INR, PROTIME  Radiology No results found.    Assessment/Plan Hypertension blood pressure control important in reducing the progression of atherosclerotic disease. On appropriate oral medications.   Carotid stenosis His duplex shows a patent left carotid endarterectomy and an 80-99% right ICA stenosis which is similar to his previous studies. His wife is adamant that no intervention will be performed for this and I would not recommend that given his age, asymptomatic status, and advancing dementia.  At this point, we will recheck this as needed  PAD (peripheral artery disease) ([place])  His ABIs today are stable at 0.72 on the right and 0.66 on the left. These are not significantly changed from his study one year ago. No limb threatening symptoms. At the request of his wife, we will check this as needed at this point. Continue Plavix and statin agent.    Leotis Pain, MD  03/05/2017 4:46 PM    This note was created with Dragon medical transcription system.  Any errors from dictation are purely unintentional

## 2017-11-08 ENCOUNTER — Encounter: Payer: Self-pay | Admitting: Emergency Medicine

## 2017-11-08 ENCOUNTER — Emergency Department: Payer: Medicare Other

## 2017-11-08 ENCOUNTER — Inpatient Hospital Stay
Admission: EM | Admit: 2017-11-08 | Discharge: 2017-11-10 | DRG: 194 | Disposition: A | Discharge status: Home Health | Payer: Medicare Other | Attending: Specialist | Admitting: Specialist

## 2017-11-08 ENCOUNTER — Other Ambulatory Visit: Payer: Self-pay

## 2017-11-08 DIAGNOSIS — I739 Peripheral vascular disease, unspecified: Secondary | ICD-10-CM | POA: Diagnosis present

## 2017-11-08 DIAGNOSIS — Z7982 Long term (current) use of aspirin: Secondary | ICD-10-CM

## 2017-11-08 DIAGNOSIS — F329 Major depressive disorder, single episode, unspecified: Secondary | ICD-10-CM | POA: Diagnosis present

## 2017-11-08 DIAGNOSIS — K219 Gastro-esophageal reflux disease without esophagitis: Secondary | ICD-10-CM | POA: Diagnosis present

## 2017-11-08 DIAGNOSIS — Z7902 Long term (current) use of antithrombotics/antiplatelets: Secondary | ICD-10-CM | POA: Diagnosis not present

## 2017-11-08 DIAGNOSIS — I160 Hypertensive urgency: Secondary | ICD-10-CM | POA: Diagnosis present

## 2017-11-08 DIAGNOSIS — J189 Pneumonia, unspecified organism: Secondary | ICD-10-CM | POA: Diagnosis present

## 2017-11-08 DIAGNOSIS — F0390 Unspecified dementia without behavioral disturbance: Secondary | ICD-10-CM | POA: Diagnosis present

## 2017-11-08 DIAGNOSIS — R0789 Other chest pain: Secondary | ICD-10-CM | POA: Diagnosis not present

## 2017-11-08 DIAGNOSIS — I10 Essential (primary) hypertension: Secondary | ICD-10-CM | POA: Diagnosis present

## 2017-11-08 DIAGNOSIS — Z9114 Patient's other noncompliance with medication regimen: Secondary | ICD-10-CM | POA: Diagnosis not present

## 2017-11-08 DIAGNOSIS — Z8249 Family history of ischemic heart disease and other diseases of the circulatory system: Secondary | ICD-10-CM | POA: Diagnosis not present

## 2017-11-08 DIAGNOSIS — Z66 Do not resuscitate: Secondary | ICD-10-CM | POA: Diagnosis present

## 2017-11-08 DIAGNOSIS — Z8673 Personal history of transient ischemic attack (TIA), and cerebral infarction without residual deficits: Secondary | ICD-10-CM

## 2017-11-08 DIAGNOSIS — W19XXXA Unspecified fall, initial encounter: Secondary | ICD-10-CM | POA: Diagnosis present

## 2017-11-08 DIAGNOSIS — K760 Fatty (change of) liver, not elsewhere classified: Secondary | ICD-10-CM | POA: Diagnosis present

## 2017-11-08 DIAGNOSIS — K449 Diaphragmatic hernia without obstruction or gangrene: Secondary | ICD-10-CM | POA: Diagnosis present

## 2017-11-08 DIAGNOSIS — I2510 Atherosclerotic heart disease of native coronary artery without angina pectoris: Secondary | ICD-10-CM | POA: Diagnosis present

## 2017-11-08 DIAGNOSIS — S2231XA Fracture of one rib, right side, initial encounter for closed fracture: Secondary | ICD-10-CM | POA: Diagnosis present

## 2017-11-08 DIAGNOSIS — Y92099 Unspecified place in other non-institutional residence as the place of occurrence of the external cause: Secondary | ICD-10-CM | POA: Diagnosis not present

## 2017-11-08 LAB — CBC WITH DIFFERENTIAL/PLATELET
Basophils Absolute: 0.1 10*3/uL (ref 0–0.1)
Basophils Relative: 1 %
EOS ABS: 0 10*3/uL (ref 0–0.7)
EOS PCT: 0 %
HCT: 43.4 % (ref 40.0–52.0)
HEMOGLOBIN: 14.3 g/dL (ref 13.0–18.0)
LYMPHS PCT: 16 %
Lymphs Abs: 1.8 10*3/uL (ref 1.0–3.6)
MCH: 28.9 pg (ref 26.0–34.0)
MCHC: 33 g/dL (ref 32.0–36.0)
MCV: 87.6 fL (ref 80.0–100.0)
Monocytes Absolute: 1.5 10*3/uL — ABNORMAL HIGH (ref 0.2–1.0)
Monocytes Relative: 13 %
NEUTROS PCT: 70 %
Neutro Abs: 8.3 10*3/uL — ABNORMAL HIGH (ref 1.4–6.5)
PLATELETS: 226 10*3/uL (ref 150–440)
RBC: 4.96 MIL/uL (ref 4.40–5.90)
RDW: 13.9 % (ref 11.5–14.5)
WBC: 11.7 10*3/uL — AB (ref 3.8–10.6)

## 2017-11-08 LAB — COMPREHENSIVE METABOLIC PANEL
ALT: 23 U/L (ref 17–63)
ANION GAP: 8 (ref 5–15)
AST: 18 U/L (ref 15–41)
Albumin: 3.9 g/dL (ref 3.5–5.0)
Alkaline Phosphatase: 65 U/L (ref 38–126)
BILIRUBIN TOTAL: 1.1 mg/dL (ref 0.3–1.2)
BUN: 20 mg/dL (ref 6–20)
CO2: 26 mmol/L (ref 22–32)
Calcium: 9 mg/dL (ref 8.9–10.3)
Chloride: 101 mmol/L (ref 101–111)
Creatinine, Ser: 1.03 mg/dL (ref 0.61–1.24)
Glucose, Bld: 196 mg/dL — ABNORMAL HIGH (ref 65–99)
Potassium: 4.2 mmol/L (ref 3.5–5.1)
Sodium: 135 mmol/L (ref 135–145)
TOTAL PROTEIN: 7.3 g/dL (ref 6.5–8.1)

## 2017-11-08 LAB — TROPONIN I: Troponin I: 0.03 ng/mL (ref <0.03)

## 2017-11-08 LAB — URINALYSIS, COMPLETE (UACMP) WITH MICROSCOPIC
Bacteria, UA: NONE SEEN
Bilirubin Urine: NEGATIVE
GLUCOSE, UA: 150 mg/dL — AB
Hgb urine dipstick: NEGATIVE
KETONES UR: NEGATIVE mg/dL
LEUKOCYTES UA: NEGATIVE
NITRITE: NEGATIVE
PH: 6 (ref 5.0–8.0)
Protein, ur: 30 mg/dL — AB
Specific Gravity, Urine: 1.02 (ref 1.005–1.030)
Squamous Epithelial / LPF: NONE SEEN

## 2017-11-08 LAB — LIPASE, BLOOD: Lipase: 26 U/L (ref 11–51)

## 2017-11-08 MED ORDER — CLOPIDOGREL BISULFATE 75 MG PO TABS
75.0000 mg | ORAL_TABLET | Freq: Every day | ORAL | Status: DC
  Administered 2017-11-09 – 2017-11-10 (×2): 75 mg via ORAL
  Filled 2017-11-08 (×2): qty 1

## 2017-11-08 MED ORDER — HYDRALAZINE HCL 20 MG/ML IJ SOLN: 10.0000 mg | INTRAMUSCULAR | Status: DC | PRN

## 2017-11-08 MED ORDER — QUETIAPINE FUMARATE 25 MG PO TABS
25.0000 mg | ORAL_TABLET | Freq: Once | ORAL | Status: AC
Start: 2017-11-09 — End: 2017-11-09
  Administered 2017-11-09: 01:00:00 25 mg via ORAL
  Filled 2017-11-08: qty 1

## 2017-11-08 MED ORDER — PANTOPRAZOLE SODIUM 40 MG IV SOLR
40.0000 mg | INTRAVENOUS | Status: DC
  Administered 2017-11-09 (×2): 40 mg via INTRAVENOUS
  Filled 2017-11-08 (×2): qty 40

## 2017-11-08 MED ORDER — LORAZEPAM BOLUS VIA INFUSION: 1.0000 mg | INTRAVENOUS | Status: DC | PRN

## 2017-11-08 MED ORDER — AZITHROMYCIN 500 MG IV SOLR
500.0000 mg | INTRAVENOUS | Status: DC
  Administered 2017-11-09: 18:00:00 500 mg via INTRAVENOUS
  Filled 2017-11-08 (×2): qty 500

## 2017-11-08 MED ORDER — ATENOLOL 50 MG PO TABS
50.0000 mg | ORAL_TABLET | Freq: Once | ORAL | Status: AC
  Administered 2017-11-09: 01:00:00 50 mg via ORAL
  Filled 2017-11-08: qty 1

## 2017-11-08 MED ORDER — CITALOPRAM HYDROBROMIDE 20 MG PO TABS
20.0000 mg | ORAL_TABLET | Freq: Every day | ORAL | Status: DC
  Administered 2017-11-09 – 2017-11-10 (×2): 20 mg via ORAL
  Filled 2017-11-08 (×2): qty 1

## 2017-11-08 MED ORDER — FLUTICASONE PROPIONATE 50 MCG/ACT NA SUSP
2.0000 | Freq: Every day | NASAL | Status: DC
  Administered 2017-11-09 – 2017-11-10 (×2): 2 via NASAL
  Filled 2017-11-08: qty 16

## 2017-11-08 MED ORDER — HYDROCODONE-ACETAMINOPHEN 5-325 MG PO TABS: 1.0000 | ORAL_TABLET | Freq: Four times a day (QID) | ORAL | Status: DC | PRN

## 2017-11-08 MED ORDER — SODIUM CHLORIDE 0.9 % IV SOLN
500.0000 mg | Freq: Once | INTRAVENOUS | Status: AC
  Administered 2017-11-08: 500 mg via INTRAVENOUS
  Filled 2017-11-08: qty 500

## 2017-11-08 MED ORDER — SODIUM CHLORIDE 0.9 % IV SOLN
1.0000 g | Freq: Once | INTRAVENOUS | Status: AC
  Administered 2017-11-08: 1 g via INTRAVENOUS
  Filled 2017-11-08: qty 10

## 2017-11-08 MED ORDER — DILTIAZEM HCL ER COATED BEADS 240 MG PO CP24
240.0000 mg | ORAL_CAPSULE | Freq: Every day | ORAL | Status: DC
  Administered 2017-11-09 – 2017-11-10 (×2): 240 mg via ORAL
  Filled 2017-11-08 (×3): qty 1

## 2017-11-08 MED ORDER — SODIUM CHLORIDE 0.9 % IV SOLN
1.0000 g | INTRAVENOUS | Status: DC
  Administered 2017-11-09: 1 g via INTRAVENOUS
  Filled 2017-11-08 (×2): qty 10

## 2017-11-08 MED ORDER — ASPIRIN EC 81 MG PO TBEC
81.0000 mg | DELAYED_RELEASE_TABLET | Freq: Every day | ORAL | Status: DC
  Administered 2017-11-09 – 2017-11-10 (×2): 81 mg via ORAL
  Filled 2017-11-08 (×2): qty 1

## 2017-11-08 MED ORDER — LISINOPRIL 20 MG PO TABS
20.0000 mg | ORAL_TABLET | Freq: Every day | ORAL | Status: DC
  Administered 2017-11-09 – 2017-11-10 (×2): 20 mg via ORAL
  Filled 2017-11-08 (×2): qty 1

## 2017-11-08 MED ORDER — FENTANYL CITRATE (PF) 100 MCG/2ML IJ SOLN
50.0000 ug | Freq: Once | INTRAMUSCULAR | Status: AC
  Administered 2017-11-08: 50 ug via INTRAVENOUS
  Filled 2017-11-08: qty 2

## 2017-11-08 MED ORDER — ENOXAPARIN SODIUM 40 MG/0.4ML ~~LOC~~ SOLN
40.0000 mg | SUBCUTANEOUS | Status: DC
  Administered 2017-11-09 (×2): 40 mg via SUBCUTANEOUS
  Filled 2017-11-08 (×2): qty 0.4

## 2017-11-08 MED ORDER — ACETAMINOPHEN 325 MG PO TABS: 650.0000 mg | ORAL_TABLET | Freq: Four times a day (QID) | ORAL | Status: DC | PRN

## 2017-11-08 MED ORDER — TEMAZEPAM 15 MG PO CAPS
15.0000 mg | ORAL_CAPSULE | Freq: Every day | ORAL | Status: DC
  Administered 2017-11-09 (×2): 15 mg via ORAL
  Filled 2017-11-08 (×2): qty 1

## 2017-11-08 MED ORDER — LORAZEPAM 2 MG/ML IJ SOLN: 1.0000 mg | INTRAMUSCULAR | Status: DC | PRN

## 2017-11-08 MED ORDER — HALOPERIDOL LACTATE 5 MG/ML IJ SOLN: 5.0000 mg | Freq: Four times a day (QID) | INTRAMUSCULAR | Status: DC | PRN

## 2017-11-08 MED ORDER — MORPHINE SULFATE (PF) 2 MG/ML IV SOLN: 2.0000 mg | INTRAVENOUS | Status: DC | PRN

## 2017-11-08 MED ORDER — LORATADINE 10 MG PO TABS
10.0000 mg | ORAL_TABLET | Freq: Every day | ORAL | Status: DC
  Administered 2017-11-09 – 2017-11-10 (×2): 10 mg via ORAL
  Filled 2017-11-08 (×2): qty 1

## 2017-11-08 NOTE — ED Provider Notes (Signed)
[facility] Emergency Department Provider Note  ____________________________________________   I have reviewed the triage vital signs and the nursing notes. Where available I have reviewed prior notes and, if possible and indicated, outside hospital notes.    HISTORY  Chief Complaint Chest Pain    HPI Charles Velasquez is a 84 y.o. male with a history of dementia, and neurologic baseline according to family, history is limited by patient dementia.  Presents today with pain to his right side which he vaguely indicates is somewhere between his axilla and his ASIS.  Is not clear how long this is been there.  Family, upon arrival, states they are not sure, they do note that the she has been coughing.  They also noticed a scratch to his nose and then not sure if he fell.  Patient has no recollection.  Level 5 chart caveat; no further history available due to patient status.   EMS states he had reproducible chest pain Past Medical History:  Diagnosis Date  . Cancer ([place])   . Hypertension     Patient Active Problem List   Diagnosis Date Noted  . Hypertension 03/05/2017  . Carotid stenosis 03/05/2017  . PAD (peripheral artery disease) ([place]) 03/05/2017    History reviewed. No pertinent surgical history.  Prior to Admission medications   Medication Sig Start Date End Date Taking? Authorizing Provider  aspirin EC 81 MG tablet Take 81 mg by mouth daily.    Yes [provider]  cetirizine (ZYRTEC) 10 MG tablet TAKE 1 TABLET BY MOUTH DAILY 01/18/17  Yes [provider]  citalopram (CELEXA) 20 MG tablet TAKE 1 TABLET BY MOUTH DAILY 08/27/16  Yes [provider]  diltiazem (DILACOR XR) 240 MG 24 hr capsule Take 240 mg by mouth daily.  07/11/10  Yes [provider]  fluticasone (FLONASE) 50 MCG/ACT nasal spray Place 2 sprays into both nostrils daily.   Yes [provider]  lisinopril (PRINIVIL,ZESTRIL) 20 MG tablet Take 20 mg by  mouth daily.   Yes [provider]  clopidogrel (PLAVIX) 75 MG tablet Take 75 mg by mouth daily.     [provider]  Tavaborole (KERYDIN) 5 % SOLN Apply 1 drop topically 1 day or 1 dose. Apply 1 drop to the toenail daily. Patient not taking: Reported on 03/05/2017 05/18/14   Trula Slade, DPM    Allergies Patient has no known allergies.  History reviewed. No pertinent family history.  Social History Social History   Tobacco Use  . Smoking status: Never Smoker  . Smokeless tobacco: Never Used  Substance Use Topics  . Alcohol use: Yes  . Drug use: Never    Review of Systems Level 5 chart caveat; no further history available due to patient status.   ____________________________________________   PHYSICAL EXAM:  VITAL SIGNS: ED Triage Vitals  Enc Vitals Group     BP 11/08/17 1743 (!) 204/80     Pulse Rate 11/08/17 1743 80     Resp 11/08/17 1743 16     Temp 11/08/17 1743 (!) 100.4 F (38 C)     Temp Source 11/08/17 1743 Oral     SpO2 11/08/17 1743 94 %     Weight 11/08/17 1744 200 lb (90.7 kg)     Height 11/08/17 1744 5\' 9"  (1.753 m)     Head Circumference --      Peak Flow --      Pain Score --      Pain  Loc --      Pain Edu? --      Excl. in [place]? --     Constitutional: Alert and oriented to name unsure of the date or year. Well appearing and in no acute distress. Eyes: Conjunctivae are normal Head: Scratch noted to the bridge of the nose, no other injury noted HEENT: No congestion/rhinnorhea. Mucous membranes are moist.  Oropharynx non-erythematous Neck:   Nontender with no meningismus, no masses, no stridor Cardiovascular: Normal rate, regular rhythm. Grossly normal heart sounds.  Good peripheral circulation. Respiratory: Normal respiratory effort.  No retractions. Lungs somewhat diminished in the bases Chest: Tender palpation to the right chest wall, I do palpate a possible rib fracture on the right. Addition, there is some right sided  flank pain is not reproducible reliably abdominal: Soft and slight right-sided pain. No distention. No guarding no rebound Back:  There is no focal tenderness or step off.  there is no midline tenderness there are no lesions noted. there is no CVA tenderness Musculoskeletal: No lower extremity tenderness, no upper extremity tenderness. No joint effusions, no DVT signs strong distal pulses no edema Neurologic:  Normal speech and language. No gross focal neurologic deficits are appreciated.  Skin:  Skin is warm, dry and intact. No rash noted. Psychiatric: Mood and affect are normal. Speech and behavior are normal.  ____________________________________________   LABS (all labs ordered are listed, but only abnormal results are displayed)  Labs Reviewed  TROPONIN I - Abnormal; Notable for the following components:      Result Value   Troponin I 0.03 (*)    All other components within normal limits  COMPREHENSIVE METABOLIC PANEL - Abnormal; Notable for the following components:   Glucose, Bld 196 (*)    All other components within normal limits  CBC WITH DIFFERENTIAL/PLATELET - Abnormal; Notable for the following components:   WBC 11.7 (*)    Neutro Abs 8.3 (*)    Monocytes Absolute 1.5 (*)    All other components within normal limits  CULTURE, BLOOD (ROUTINE X 2)  CULTURE, BLOOD (ROUTINE X 2)  LIPASE, BLOOD  URINALYSIS, COMPLETE (UACMP) WITH MICROSCOPIC    Pertinent labs  results that were available during my care of the patient were reviewed by me and considered in my medical decision making (see chart for details). ____________________________________________  EKG  I personally interpreted any EKGs ordered by me or triage Sinus rhythm rate 79 bpm no acute ST elevation or depression nonspecific ST changes normal axis ____________________________________________  RADIOLOGY  Pertinent labs & imaging results that were available during my care of the patient were reviewed by me and  considered in my medical decision making (see chart for details). If possible, patient and/or family made aware of any abnormal findings.  Dg Chest 2 View  Result Date: 11/08/2017 CLINICAL DATA:  Right-sided chest pain over the last day.  Dementia. EXAM: CHEST - 2 VIEW COMPARISON:  03/27/2013 FINDINGS: Heart size is normal. Chronic aortic atherosclerosis. Possible venous hypertension with early interstitial edema. Some volume loss at the right base which could be atelectasis or early pneumonia. Question right lateral rib fracture, not definite. IMPRESSION: Right lung base atelectasis and or infiltrate. Question right lateral rib fracture. Possible venous hypertension and early edema. Electronically Signed   By: Nelson Chimes M.D.   On: 11/08/2017 18:50   Ct Head Wo Contrast  Result Date: 11/08/2017 CLINICAL DATA:  Right-sided headache.  Dementia. EXAM: CT HEAD WITHOUT CONTRAST TECHNIQUE: Contiguous axial images were obtained  from the base of the skull through the vertex without intravenous contrast. COMPARISON:  06/27/2016 FINDINGS: Brain: Generalized atrophy. Chronic small-vessel ischemic changes of the white matter. 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 No mass lesion, hemorrhage, hydrocephalus or extra-axial collection. Vascular: There is atherosclerotic calcification of the major vessels at the base of the brain. Skull: Negative Sinuses/Orbits: Clear/normal Other: None IMPRESSION: Atrophy and chronic small-vessel ischemic changes. No acute or reversible finding. Electronically Signed   By: Nelson Chimes M.D.   On: 11/08/2017 19:19   Ct Renal Stone Study  Result Date: 11/08/2017 CLINICAL DATA:  Right-sided abdominal pain.  Dementia. EXAM: CT ABDOMEN AND PELVIS WITHOUT CONTRAST TECHNIQUE: Multidetector CT imaging of the abdomen and pelvis was performed following the standard protocol without IV contrast. COMPARISON:  None. FINDINGS: Lower chest: Indistinct airspace opacity in the right lower lobe  suspicious for pneumonia. Bandlike opacities in the lingula and right middle lobe favoring atelectasis. Small right pleural effusion. Coronary atherosclerosis. Calcified aortic valve. Mild cardiomegaly. Hepatobiliary: Diffuse hepatic steatosis. Pancreas: Unremarkable Spleen: Unremarkable Adrenals/Urinary Tract: Vascular calcifications in both renal hila. Scarring in portions of the left kidney. Two adjacent bladder calculi are present, one 3 mm and one 2 mm in diameter on image 91/5. No other urinary tract calculi are observed. Stomach/Bowel: Small type 1 hiatal hernia. Otherwise unremarkable. Appendix not well visualized but I do not observe an inflammatory process in the right lower quadrant. Vascular/Lymphatic: Aortoiliac atherosclerotic vascular disease. No pathologic adenopathy identified. Reproductive: Prostatectomy. Dense metal clips are present in the lower pelvis likely from prior lymph node dissection. Other: No supplemental non-categorized findings. Musculoskeletal: Bridging spurring of the left sacroiliac joint. No compelling findings for prostate osseous metastatic disease. Lower thoracic and lumbar spondylosis. Fatty calcification in both rectus femoris muscles. IMPRESSION: 1. Right lower lobe airspace opacity is nonspecific but could certainly represent pneumonia. Small right pleural effusion. 2. Nonvisualization of the appendix, although no right lower quadrant or pericecal inflammatory process is identified. 3. Two small bladder calculi are present. 4. Other imaging findings of potential clinical significance: Athero coronary atherosclerosis. Mild cardiomegaly. Calcified aortic valve. Diffuse hepatic steatosis. Scarring in the left kidney. Small type 1 hiatal hernia. Prostatectomy with pelvic lymph node dissection in the past. Electronically Signed   By: Van Clines M.D.   On: 11/08/2017 18:31   ____________________________________________    PROCEDURES  Procedure(s) performed:  None  Procedures  Critical Care performed: None  ____________________________________________   INITIAL IMPRESSION / ASSESSMENT AND PLAN / ED COURSE  Pertinent labs & imaging results that were available during my care of the patient were reviewed by me and considered in my medical decision making (see chart for details).  Very difficult patient because of very vague complaint, he does have reproducible pain mostly to his ribs on the right although there is some abdominal discomfort which seems to be transitory in nature and some CVA tenderness that seems to come and go.  Most reliably has pain on the right side.  He has somewhat diminished on the right in his lungs as well and the family states he has been coughing.  He also has a scratch over the bridge of his nose.    Difficult to determine exactly what is causing this I did a CT scan of his abdomen as his blood pressure was up.  He has very strong femoral pulses unfortunately there is no evidence of a aneurysm with referred discomfort.  Chest x-ray and CT both show what it looks appears to be a  pneumonia and chest x-ray shows what could be a rib fracture and I do palpate what I think is a rib fracture on that side.  It is unclear if there was a fall and then a rib fracture leading to a pneumonia or if there was a pneumonia leading to a fall and rib fracture or he was hurt his ribs coughing.  In any event however we are giving him antibiotics given his age, and deconditioned state, I think with a pleural effusion and what appears to be a pneumonia with a rib fracture we will admit him for observation.    ____________________________________________   FINAL CLINICAL IMPRESSION(S) / ED DIAGNOSES  Final diagnoses:  None      This chart was dictated using voice recognition software.  Despite best efforts to proofread,  errors can occur which can change meaning.      Schuyler Amor, MD 11/08/17 1949

## 2017-11-08 NOTE — ED Triage Notes (Signed)
Pt to ED via EMS from home c/o right side pain x1 day, hx dementia, unsure if fall or injury, pain worse with palpation and deep breaths.

## 2017-11-08 NOTE — Progress Notes (Signed)
Family Meeting Note  Advance Directive:yes  Today a meeting took place with the Patient, wife, and son.  Patient is unable to participate due OI:ZTIWPY capacity dementia   The following clinical team members were present during this meeting:MD  The following were discussed:Patient's diagnosis: , Patient's progosis: Unable to determine and Goals for treatment: DNR  Additional follow-up to be provided: prn  Time spent during discussion:20 minutes  Gorden Harms, MD

## 2017-11-08 NOTE — H&P (Signed)
[facility] [place] NAME: Charles Velasquez    MR#:  [id number]  DATE OF BIRTH:  09-Apr-1928  DATE OF ADMISSION:  11/08/2017  PRIMARY CARE PHYSICIAN: [name], NP   REQUESTING/REFERRING PHYSICIAN:   CHIEF COMPLAINT:   Chief Complaint  Patient presents with  . Chest Pain    HISTORY OF PRESENT ILLNESS: Charles Velasquez  is a 84 y.o. male with a known history per below which also includes dementia, hyperlipidemia, coronary artery disease, GERD, peripheral vascular disease, noncompliant with medications, presenting to the emergency room at the request of family for right-sided pain, patient was doubled over at the adult daycare facility earlier today, in the emergency room patient was found to be febrile, blood pressure greater than 200, noted blood on the bridge of his nose concerning for recent fall, white count 11.7, CT head was negative, CT abdomen noted for right pneumonia/hepatic steatosis/hiatal hernia, chest x-ray noted for possible edema/questionable right rib fracture, patient evaluated at the bedside in the emergency room, patient is a poor historian due to advanced dementia, wife and son are at the bedside, patient now been admitted for acute right-sided pneumonia, acute probable right rib fractures status post unwitnessed fall, and hypertensive urgency due to noncompliance of medication.  PAST MEDICAL HISTORY:   Past Medical History:  Diagnosis Date  . Cancer ([place])   . Hypertension   See above  PAST SURGICAL HISTORY:  Prostatectomy Appendectomy Septoplasty Carotid endarterectomy  SOCIAL HISTORY:  Social History   Tobacco Use  . Smoking status: Never Smoker  . Smokeless tobacco: Never Used  Substance Use Topics  . Alcohol use: Yes    FAMILY HISTORY:  Alcohol abuse-father Heart failure-mother  DRUG ALLERGIES: NKDA  REVIEW OF SYSTEMS: Unable to be obtained given severe dementia  MEDICATIONS AT HOME:  Prior to  Admission medications   Medication Sig Start Date End Date Taking? Authorizing Provider  aspirin EC 81 MG tablet Take 81 mg by mouth daily.    Yes [provider]  cetirizine (ZYRTEC) 10 MG tablet TAKE 1 TABLET BY MOUTH DAILY 01/18/17  Yes [provider]  citalopram (CELEXA) 20 MG tablet TAKE 1 TABLET BY MOUTH DAILY 08/27/16  Yes [provider]  diltiazem (DILACOR XR) 240 MG 24 hr capsule Take 240 mg by mouth daily.  07/11/10  Yes [provider]  fluticasone (FLONASE) 50 MCG/ACT nasal spray Place 2 sprays into both nostrils daily.   Yes [provider]  lisinopril (PRINIVIL,ZESTRIL) 20 MG tablet Take 20 mg by mouth daily.   Yes [provider]  clopidogrel (PLAVIX) 75 MG tablet Take 75 mg by mouth daily.     [provider]      PHYSICAL EXAMINATION:   VITAL SIGNS: Blood pressure (!) 202/85, pulse 80, temperature 99.5 F (37.5 C), temperature source Oral, resp. rate 16, height 5\' 9"  (1.753 m), weight 90.7 kg (200 lb), SpO2 94 %.  GENERAL:  84 y.o.-year-old patient lying in the bed with no acute distress.  Obese appearance EYES: Pupils equal, round, reactive to light and accommodation. No scleral icterus. Extraocular muscles intact.  HEENT: Head atraumatic, normocephalic. Oropharynx and nasopharynx clear.  NECK:  Supple, no jugular venous distention. No thyroid enlargement, no tenderness.  LUNGS: Normal breath sounds bilaterally, no wheezing, rales,rhonchi or crepitation. No use of accessory muscles of respiration.  Reproducible tenderness over right chest wall awake, alert, CARDIOVASCULAR: S1, S2 normal. No murmurs, rubs, or gallops.  ABDOMEN: Soft, nontender,  nondistended. Bowel sounds present. No organomegaly or mass.  EXTREMITIES: No pedal edema, cyanosis, or clubbing.  NEUROLOGIC: Cranial nerves II through XII are intact. MAES  PSYCHIATRIC: The patient is confused, disoriented   SKIN: No obvious rash, lesion, or ulcer.    LABORATORY PANEL:   CBC Recent Labs  Lab 11/08/17 1812  WBC 11.7*  HGB 14.3  HCT 43.4  PLT 226  MCV 87.6  MCH 28.9  MCHC 33.0  RDW 13.9  LYMPHSABS 1.8  MONOABS 1.5*  EOSABS 0.0  BASOSABS 0.1   ------------------------------------------------------------------------------------------------------------------  Chemistries  Recent Labs  Lab 11/08/17 1812  NA 135  K 4.2  CL 101  CO2 26  GLUCOSE 196*  BUN 20  CREATININE 1.03  CALCIUM 9.0  AST 18  ALT 23  ALKPHOS 65  BILITOT 1.1   ------------------------------------------------------------------------------------------------------------------ estimated creatinine clearance is 56.2 mL/min (by C-G formula based on SCr of 1.03 mg/dL). ------------------------------------------------------------------------------------------------------------------ No results for input(s): TSH, T4TOTAL, T3FREE, THYROIDAB in the last 72 hours.  Invalid input(s): FREET3   Coagulation profile No results for input(s): INR, PROTIME in the last 168 hours. ------------------------------------------------------------------------------------------------------------------- No results for input(s): DDIMER in the last 72 hours. -------------------------------------------------------------------------------------------------------------------  Cardiac Enzymes Recent Labs  Lab 11/08/17 1812  TROPONINI 0.03*   ------------------------------------------------------------------------------------------------------------------ Invalid input(s): POCBNP  ---------------------------------------------------------------------------------------------------------------  Urinalysis    Component Value Date/Time   COLORURINE Yellow 12/12/2013 0022   APPEARANCEUR Clear 12/12/2013 0022   LABSPEC 1.015 12/12/2013 0022   PHURINE 5.0 12/12/2013 0022   GLUCOSEU Negative 12/12/2013 0022   HGBUR Negative 12/12/2013 0022   BILIRUBINUR Negative 12/12/2013  0022   KETONESUR Trace 12/12/2013 0022   PROTEINUR Negative 12/12/2013 0022   NITRITE Negative 12/12/2013 0022   LEUKOCYTESUR Negative 12/12/2013 0022     RADIOLOGY: Dg Chest 2 View  Result Date: 11/08/2017 CLINICAL DATA:  Right-sided chest pain over the last day.  Dementia. EXAM: CHEST - 2 VIEW COMPARISON:  03/27/2013 FINDINGS: Heart size is normal. Chronic aortic atherosclerosis. Possible venous hypertension with early interstitial edema. Some volume loss at the right base which could be atelectasis or early pneumonia. Question right lateral rib fracture, not definite. IMPRESSION: Right lung base atelectasis and or infiltrate. Question right lateral rib fracture. Possible venous hypertension and early edema. Electronically Signed   By: Nelson Chimes M.D.   On: 11/08/2017 18:50   Ct Head Wo Contrast  Result Date: 11/08/2017 CLINICAL DATA:  Right-sided headache.  Dementia. EXAM: CT HEAD WITHOUT CONTRAST TECHNIQUE: Contiguous axial images were obtained from the base of the skull through the vertex without intravenous contrast. COMPARISON:  06/27/2016 FINDINGS: Brain: Generalized atrophy. Chronic small-vessel ischemic changes of the white matter. 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 No mass lesion, hemorrhage, hydrocephalus or extra-axial collection. Vascular: There is atherosclerotic calcification of the major vessels at the base of the brain. Skull: Negative Sinuses/Orbits: Clear/normal Other: None IMPRESSION: Atrophy and chronic small-vessel ischemic changes. No acute or reversible finding. Electronically Signed   By: Nelson Chimes M.D.   On: 11/08/2017 19:19   Ct Renal Stone Study  Result Date: 11/08/2017 CLINICAL DATA:  Right-sided abdominal pain.  Dementia. EXAM: CT ABDOMEN AND PELVIS WITHOUT CONTRAST TECHNIQUE: Multidetector CT imaging of the abdomen and pelvis was performed following the standard protocol without IV contrast. COMPARISON:  None. FINDINGS: Lower chest: Indistinct airspace  opacity in the right lower lobe suspicious for pneumonia. Bandlike opacities in the lingula and right middle lobe favoring atelectasis. Small right pleural effusion. Coronary atherosclerosis. Calcified aortic valve. Mild cardiomegaly. Hepatobiliary: Diffuse  hepatic steatosis. Pancreas: Unremarkable Spleen: Unremarkable Adrenals/Urinary Tract: Vascular calcifications in both renal hila. Scarring in portions of the left kidney. Two adjacent bladder calculi are present, one 3 mm and one 2 mm in diameter on image 91/5. No other urinary tract calculi are observed. Stomach/Bowel: Small type 1 hiatal hernia. Otherwise unremarkable. Appendix not well visualized but I do not observe an inflammatory process in the right lower quadrant. Vascular/Lymphatic: Aortoiliac atherosclerotic vascular disease. No pathologic adenopathy identified. Reproductive: Prostatectomy. Dense metal clips are present in the lower pelvis likely from prior lymph node dissection. Other: No supplemental non-categorized findings. Musculoskeletal: Bridging spurring of the left sacroiliac joint. No compelling findings for prostate osseous metastatic disease. Lower thoracic and lumbar spondylosis. Fatty calcification in both rectus femoris muscles. IMPRESSION: 1. Right lower lobe airspace opacity is nonspecific but could certainly represent pneumonia. Small right pleural effusion. 2. Nonvisualization of the appendix, although no right lower quadrant or pericecal inflammatory process is identified. 3. Two small bladder calculi are present. 4. Other imaging findings of potential clinical significance: Athero coronary atherosclerosis. Mild cardiomegaly. Calcified aortic valve. Diffuse hepatic steatosis. Scarring in the left kidney. Small type 1 hiatal hernia. Prostatectomy with pelvic lymph node dissection in the past. Electronically Signed   By: Van Clines M.D.   On: 11/08/2017 18:31    EKG: Orders placed or performed during the hospital encounter  of 11/08/17  . ED EKG  . ED EKG  . EKG 12-Lead  . EKG 12-Lead    IMPRESSION AND PLAN: 1 acute right community-acquired pneumonia Pneumonia protocol, empiric Rocephin/azithromycin, follow-up on cultures  2 acute probable right rib fracture  status post unwitnessed fall secondary to dementia Fall precautions, adult pain protocol while in house  3 acute accelerated hypertension Due to noncompliance with medication Resume home regiment, atenolol 50 mg p.o. x1 now, hydralazine as needed systolic blood pressure greater than 160, vitals per routine, make changes as per necessary  4 chronic severe dementia Increase nursing care as needed, aspiration/fall/skin care precautions while in house Ativan/Haldol as needed anxiety/agitation  5 chronic GERD without esophagitis Stable PPI daily  6 history of coronary artery disease Stable Aspirin, Plavix, lisinopril   All the records are reviewed and case discussed with ED provider. Management plans discussed with the patient, family and they are in agreement.  CODE STATUS:DNR    TOTAL TIME TAKING CARE OF THIS PATIENT: 45 minutes.    Avel Peace Xandria Gallaga M.D on 11/08/2017   Between 7am to 6pm - Pager - [phone]  After 6pm go to [email or web address] - password [email or web address] [facility]  Office  [phone]  CC: Primary care physician; [name], NP   Note: This dictation was prepared with Dragon dictation along with smaller phrase technology. Any transcriptional errors that result from this process are unintentional.

## 2017-11-09 LAB — BLOOD CULTURE ID PANEL (REFLEXED)
ACINETOBACTER BAUMANNII: NOT DETECTED
Candida albicans: NOT DETECTED
Candida glabrata: NOT DETECTED
Candida krusei: NOT DETECTED
Candida parapsilosis: NOT DETECTED
Candida tropicalis: NOT DETECTED
ENTEROBACTERIACEAE SPECIES: NOT DETECTED
ENTEROCOCCUS SPECIES: NOT DETECTED
Enterobacter cloacae complex: NOT DETECTED
Escherichia coli: NOT DETECTED
HAEMOPHILUS INFLUENZAE: NOT DETECTED
Klebsiella oxytoca: NOT DETECTED
Klebsiella pneumoniae: NOT DETECTED
LISTERIA MONOCYTOGENES: NOT DETECTED
METHICILLIN RESISTANCE: NOT DETECTED
NEISSERIA MENINGITIDIS: NOT DETECTED
PSEUDOMONAS AERUGINOSA: NOT DETECTED
Proteus species: NOT DETECTED
STAPHYLOCOCCUS AUREUS BCID: NOT DETECTED
STREPTOCOCCUS AGALACTIAE: NOT DETECTED
STREPTOCOCCUS PNEUMONIAE: NOT DETECTED
STREPTOCOCCUS SPECIES: NOT DETECTED
Serratia marcescens: NOT DETECTED
Staphylococcus species: DETECTED — AB
Streptococcus pyogenes: NOT DETECTED

## 2017-11-09 LAB — STREP PNEUMONIAE URINARY ANTIGEN: Strep Pneumo Urinary Antigen: NEGATIVE

## 2017-11-09 MED ORDER — QUETIAPINE FUMARATE 25 MG PO TABS
25.0000 mg | ORAL_TABLET | Freq: Every evening | ORAL | Status: DC | PRN
  Administered 2017-11-09: 22:00:00 25 mg via ORAL
  Filled 2017-11-09: qty 1

## 2017-11-09 NOTE — Progress Notes (Signed)
PHARMACY - PHYSICIAN COMMUNICATION CRITICAL VALUE ALERT - BLOOD CULTURE IDENTIFICATION (BCID)  Results for orders placed or performed during the hospital encounter of 11/08/17  Blood Culture ID Panel (Reflexed) (Collected: 11/08/2017  8:16 PM)  Result Value Ref Range   Enterococcus species NOT DETECTED NOT DETECTED   Listeria monocytogenes NOT DETECTED NOT DETECTED   Staphylococcus species DETECTED (A) NOT DETECTED   Staphylococcus aureus NOT DETECTED NOT DETECTED   Methicillin resistance NOT DETECTED NOT DETECTED   Streptococcus species NOT DETECTED NOT DETECTED   Streptococcus agalactiae NOT DETECTED NOT DETECTED   Streptococcus pneumoniae NOT DETECTED NOT DETECTED   Streptococcus pyogenes NOT DETECTED NOT DETECTED   Acinetobacter baumannii NOT DETECTED NOT DETECTED   Enterobacteriaceae species NOT DETECTED NOT DETECTED   Enterobacter cloacae complex NOT DETECTED NOT DETECTED   Escherichia coli NOT DETECTED NOT DETECTED   Klebsiella oxytoca NOT DETECTED NOT DETECTED   Klebsiella pneumoniae NOT DETECTED NOT DETECTED   Proteus species NOT DETECTED NOT DETECTED   Serratia marcescens NOT DETECTED NOT DETECTED   Haemophilus influenzae NOT DETECTED NOT DETECTED   Neisseria meningitidis NOT DETECTED NOT DETECTED   Pseudomonas aeruginosa NOT DETECTED NOT DETECTED   Candida albicans NOT DETECTED NOT DETECTED   Candida glabrata NOT DETECTED NOT DETECTED   Candida krusei NOT DETECTED NOT DETECTED   Candida parapsilosis NOT DETECTED NOT DETECTED   Candida tropicalis NOT DETECTED NOT DETECTED    Name of physician (or Provider) Contacted: [place]  Changes to prescribed antibiotics required: none  Napoleon Form 11/09/2017  10:13 PM

## 2017-11-09 NOTE — Care Management (Addendum)
CM consult for home health needs.  Patient lives at home with his wife and does have advanced dementia.  He is able to ambulate at home without walker.  He will not always get into the shower for wife so just this week hired a person to come into the home to assist patient with personal care and patient did participate with the caregiver.  There are no issues with transportation or obtaining medications.  Current with PCP. Wife would be in agreement with home health services if it is recommended.  PT consult is pending.  Wife is a little hard of hearing. Need to be close to her when conversing.  Admitted with pneumonia and found to have a rib fracture. Room air.

## 2017-11-09 NOTE — Evaluation (Signed)
Physical Therapy Evaluation Patient Details Name: Charles Velasquez MRN: [id number] DOB: 1928/05/23 Today's Date: 11/09/2017   History of Present Illness  Pt is a 84 y/o M presenting to hospital w/ R side pain 11/08/17. Pt admitted 11/08/17 w/ diagnosis of R side pneumonia, and probable R rib fx.  PMHx includes: dementia, HTN, carotid stenosis, PAD.    Clinical Impression  Pt demonstrated bed mobility (CGA), transfers and ambulation (60 ft,10 ft, 60 ft) (RW CGA) (see mobility section for details). Pt demonstrated shortened steps and decreased DF in L foot; Pt responds to simple commands "pick up left foot", "take big steps". Pt's son reported that his baseline ambulation is independent; Pt demonstrated better stability w/ RW today (see ambulation for details). Pt is poor historian due to advanced dementia; Pt's son [name] present during session and provided contact info to make arrangements for Pt (Howard's phone number [phone]). [name] reports that Pt currently lives w/ wife and has caregiver assistance 2x per week, as well as 3 grown children that check in on him. Son stated he is aware that caregiver assistance should increase at this point. Family member educated that Pt's case management would help him make a plan. Pt would benefit from skilled PT to progress strength, ambulation, and optimal return to PLOF. Recommend HHPT w/ 24/7 assistance/supervision upon discharge from acute hospitalization.    Follow Up Recommendations Home health PT;Supervision/Assistance - 24 hour    Equipment Recommendations  Rolling walker with 5" wheels    Recommendations for Other Services       Precautions / Restrictions Precautions Precautions: Fall Precaution Comments: Aspiration Restrictions Weight Bearing Restrictions: No      Mobility  Bed Mobility Overal bed mobility: Needs Assistance Bed Mobility: Supine to Sit     Supine to sit: Min guard;HOB elevated     General bed mobility comments: Pt  required extra time and effort; CGA for safety   Transfers Overall transfer level: Needs assistance Equipment used: Rolling walker (2 wheeled) Transfers: Sit to/from Stand Sit to Stand: Min guard;Min assist         General transfer comment: CGA to min assist to lift  Ambulation/Gait Ambulation/Gait assistance: Min guard;+2 safety/equipment Ambulation Distance (Feet): 130 Feet(60,10,60) Assistive device: Rolling walker (2 wheeled) Gait Pattern/deviations: Decreased dorsiflexion - left;Decreased step length - right;Decreased step length - left;Trunk flexed;Step-through pattern Gait velocity: decreased   General Gait Details: RW CGA (+2 chair follow); vc's to take bigger steps, lift left foot, stand up tall. Pt ambulated 54 ft w/ RW CGA; then trialed 10 ft without RW CGA demonstrating decreased stability; then ambulated another 60 ft RW CGA, Pt then reported fatigue and requested to sit. Pt returned to room in recliner.  Stairs            Wheelchair Mobility    Modified Rankin (Stroke Patients Only)       Balance Overall balance assessment: Needs assistance Sitting-balance support: No upper extremity supported;Feet supported Sitting balance-Leahy Scale: Good Sitting balance - Comments: good sitting balance EOB     Standing balance-Leahy Scale: Fair Standing balance comment: fair standing balance EOB prior to ambulation                             Pertinent Vitals/Pain Pain Assessment: 0-10 Pain Score: 0-No pain Pain Location: R side Pain Intervention(s): Limited activity within patient's tolerance;Monitored during session    [place] expects to be discharged to:: Private residence  Living Arrangements: Spouse/significant other Available Help at Discharge: Family;Available 24 hours/day Type of Home: House Home Access: Stairs to enter Entrance Stairs-Rails: Left Entrance Stairs-Number of Steps: 2 Home Layout: Two level;Able to live  on main level with bedroom/bathroom Home Equipment: Shower seat;Walker - 2 wheels      Prior Function Level of Independence: Needs assistance   Gait / Transfers Assistance Needed: Son [name] reports Pt has been walking independently household and community distances, but requires supervision assistance due to dementia.           Hand Dominance   Dominant Hand: Right    Extremity/Trunk Assessment   Upper Extremity Assessment Upper Extremity Assessment: Generalized weakness;RUE deficits/detail;LUE deficits/detail RUE Deficits / Details: Pt AROM WFL and strength at least 2/5 demonstrated by bed transfer supine to sit and ambulation w/ RW CGA. Pt confused w/ ROM commands, so functional tasks were used for assessment. LUE Deficits / Details: Pt AROM WFL and strength at least 2/5 demonstrated by bed transfer supine to sit and ambulation w/ RW CGA. Pt confused w/ ROM commands, so functional tasks were used for assessment.    Lower Extremity Assessment Lower Extremity Assessment: Generalized weakness;LLE deficits/detail;RLE deficits/detail RLE Deficits / Details: Pt AROM WFL and strength at least 2/5 demonstrated by bed transfer supine to sit and ambulation w/ RW CGA. Pt confused w/ ROM commands, so functional tasks were used for assessment. LLE Deficits / Details: Pt AROM WFL and strength at least 2/5 demonstrated by bed transfer supine to sit and ambulation w/ RW CGA. Pt confused w/ ROM commands, so functional tasks were used for assessment.    Cervical / Trunk Assessment Cervical / Trunk Assessment: Kyphotic  Communication   Communication: Other (comment)(history of advanced dementia; Pt follows simple commands)  Cognition Arousal/Alertness: Lethargic Behavior During Therapy: WFL for tasks assessed/performed Overall Cognitive Status: History of cognitive impairments - at baseline                                 General Comments: Pt asleep upon PT arrival; however he  was pleasant and agreeable to session when he woke up. Pt follows simple commands.      General Comments  Pt agreeable to session    Exercises     Assessment/Plan    PT Assessment Patient needs continued PT services  PT Problem List Decreased strength;Decreased activity tolerance;Decreased safety awareness;Decreased knowledge of use of DME;Decreased balance;Decreased mobility;Decreased coordination;Decreased knowledge of precautions;Cardiopulmonary status limiting activity;Pain       PT Treatment Interventions DME instruction;Gait training;Stair training;Balance training;Functional mobility training;Therapeutic activities;Therapeutic exercise;Patient/family education    PT Goals (Current goals can be found in the Care Plan section)  Acute Rehab PT Goals Patient Stated Goal: to go home PT Goal Formulation: With patient/family(son [name]) Time For Goal Achievement: 11/23/17 Potential to Achieve Goals: Good    Frequency Min 2X/week   Barriers to discharge        Co-evaluation               AM-PAC PT "6 Clicks" Daily Activity  Outcome Measure Difficulty turning over in bed (including adjusting bedclothes, sheets and blankets)?: A Little Difficulty moving from lying on back to sitting on the side of the bed? : A Little Difficulty sitting down on and standing up from a chair with arms (e.g., wheelchair, bedside commode, etc,.)?: Unable Help needed moving to and from a bed to chair (including a wheelchair)?: A Little  Help needed walking in hospital room?: A Little Help needed climbing 3-5 steps with a railing? : A Lot 6 Click Score: 15    End of Session Equipment Utilized During Treatment: Gait belt Activity Tolerance: Patient limited by fatigue Patient left: in chair;with call bell/phone within reach;with chair alarm set;with family/visitor present(B heels elevated by pillow) Nurse Communication: Mobility status PT Visit Diagnosis: Unsteadiness on feet (R26.81);Other  abnormalities of gait and mobility (R26.89);Difficulty in walking, not elsewhere classified (R26.2);Pain Pain - Right/Left: Right Pain - part of body: (side)    Time: 4621-9471 PT Time Calculation (min) (ACUTE ONLY): 35 min   Charges:         PT G Codes:         Naira Standiford Mondrian-Pardue, SPT 11/09/2017, 4:09 PM

## 2017-11-09 NOTE — Progress Notes (Signed)
[place] [facility] [place] NAME: Charles Velasquez    MR#:  [id number]  DATE OF BIRTH:  June 09, 1928  SUBJECTIVE:   Patient here due to some vague left-sided chest pain and chest x-ray findings suggestive of pneumonia.  Patient was also noted to have a right-sided rib fractures.  Patient is quite lethargic this morning as he did not sleep well last night.  Wife is at bedside.  REVIEW OF SYSTEMS:    Review of Systems  Unable to perform ROS: Mental acuity    Nutrition: Heart Healthy Tolerating Diet: No Tolerating PT:  Await Eval.    DRUG ALLERGIES:  No Known Allergies  VITALS:  Blood pressure (!) 149/75, pulse 62, temperature 98.6 F (37 C), temperature source Oral, resp. rate 20, height 5\' 9"  (1.753 m), weight 101.6 kg (224 lb), SpO2 95 %.  PHYSICAL EXAMINATION:   Physical Exam  GENERAL:  84 y.o.-year-old patient lying in bed lethargic but in NAD.  EYES: Pupils equal, round, reactive to light and accommodation. No scleral icterus. Extraocular muscles intact.  HEENT: Head atraumatic, normocephalic. Oropharynx and nasopharynx clear.  NECK:  Supple, no jugular venous distention. No thyroid enlargement, no tenderness.  LUNGS: Normal breath sounds bilaterally, no wheezing, rales, rhonchi. No use of accessory muscles of respiration.  CARDIOVASCULAR: S1, S2 normal. No murmurs, rubs, or gallops.  ABDOMEN: Soft, nontender, nondistended. Bowel sounds present. No organomegaly or mass.  EXTREMITIES: No cyanosis, clubbing or edema b/l.    NEUROLOGIC: Cranial nerves II through XII are intact. No focal Motor or sensory deficits b/l. Globally weak. Difficult to assess.   PSYCHIATRIC: The patient is alert and oriented x 1.  SKIN: No obvious rash, lesion, or ulcer.    LABORATORY PANEL:   CBC Recent Labs  Lab 11/08/17 1812  WBC 11.7*  HGB 14.3  HCT 43.4  PLT 226    ------------------------------------------------------------------------------------------------------------------  Chemistries  Recent Labs  Lab 11/08/17 1812  NA 135  K 4.2  CL 101  CO2 26  GLUCOSE 196*  BUN 20  CREATININE 1.03  CALCIUM 9.0  AST 18  ALT 23  ALKPHOS 65  BILITOT 1.1   ------------------------------------------------------------------------------------------------------------------  Cardiac Enzymes Recent Labs  Lab 11/08/17 1812  TROPONINI 0.03*   ------------------------------------------------------------------------------------------------------------------  RADIOLOGY:  Dg Chest 2 View  Result Date: 11/08/2017 CLINICAL DATA:  Right-sided chest pain over the last day.  Dementia. EXAM: CHEST - 2 VIEW COMPARISON:  03/27/2013 FINDINGS: Heart size is normal. Chronic aortic atherosclerosis. Possible venous hypertension with early interstitial edema. Some volume loss at the right base which could be atelectasis or early pneumonia. Question right lateral rib fracture, not definite. IMPRESSION: Right lung base atelectasis and or infiltrate. Question right lateral rib fracture. Possible venous hypertension and early edema. Electronically Signed   By: Nelson Chimes M.D.   On: 11/08/2017 18:50   Ct Head Wo Contrast  Result Date: 11/08/2017 CLINICAL DATA:  Right-sided headache.  Dementia. EXAM: CT HEAD WITHOUT CONTRAST TECHNIQUE: Contiguous axial images were obtained from the base of the skull through the vertex without intravenous contrast. COMPARISON:  06/27/2016 FINDINGS: Brain: Generalized atrophy. Chronic small-vessel ischemic changes of the white matter. 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 No mass lesion, hemorrhage, hydrocephalus or extra-axial collection. Vascular: There is atherosclerotic calcification of the major vessels at the base of the brain. Skull: Negative Sinuses/Orbits: Clear/normal Other: None IMPRESSION: Atrophy and chronic small-vessel ischemic  changes. No acute or reversible finding. Electronically Signed   By: Nelson Chimes  M.D.   On: 11/08/2017 19:19   Ct Renal Stone Study  Result Date: 11/08/2017 CLINICAL DATA:  Right-sided abdominal pain.  Dementia. EXAM: CT ABDOMEN AND PELVIS WITHOUT CONTRAST TECHNIQUE: Multidetector CT imaging of the abdomen and pelvis was performed following the standard protocol without IV contrast. COMPARISON:  None. FINDINGS: Lower chest: Indistinct airspace opacity in the right lower lobe suspicious for pneumonia. Bandlike opacities in the lingula and right middle lobe favoring atelectasis. Small right pleural effusion. Coronary atherosclerosis. Calcified aortic valve. Mild cardiomegaly. Hepatobiliary: Diffuse hepatic steatosis. Pancreas: Unremarkable Spleen: Unremarkable Adrenals/Urinary Tract: Vascular calcifications in both renal hila. Scarring in portions of the left kidney. Two adjacent bladder calculi are present, one 3 mm and one 2 mm in diameter on image 91/5. No other urinary tract calculi are observed. Stomach/Bowel: Small type 1 hiatal hernia. Otherwise unremarkable. Appendix not well visualized but I do not observe an inflammatory process in the right lower quadrant. Vascular/Lymphatic: Aortoiliac atherosclerotic vascular disease. No pathologic adenopathy identified. Reproductive: Prostatectomy. Dense metal clips are present in the lower pelvis likely from prior lymph node dissection. Other: No supplemental non-categorized findings. Musculoskeletal: Bridging spurring of the left sacroiliac joint. No compelling findings for prostate osseous metastatic disease. Lower thoracic and lumbar spondylosis. Fatty calcification in both rectus femoris muscles. IMPRESSION: 1. Right lower lobe airspace opacity is nonspecific but could certainly represent pneumonia. Small right pleural effusion. 2. Nonvisualization of the appendix, although no right lower quadrant or pericecal inflammatory process is identified. 3. Two small  bladder calculi are present. 4. Other imaging findings of potential clinical significance: Athero coronary atherosclerosis. Mild cardiomegaly. Calcified aortic valve. Diffuse hepatic steatosis. Scarring in the left kidney. Small type 1 hiatal hernia. Prostatectomy with pelvic lymph node dissection in the past. Electronically Signed   By: Van Clines M.D.   On: 11/08/2017 18:31     ASSESSMENT AND PLAN:   84 year old male with past medical history of dementia, previous CVA, hypertension, hyperlipidemia, history of peripheral vascular disease, coronary artery disease, GERD who presents to the hospital due to right-sided chest pain and suspected to have a pneumonia.  1.  Pneumonia-likely community acquired pneumonia. -Patient is a poor historian, and therefore difficult to assess if he is having any respiratory symptoms.  Patient's chest x-ray was suggestive of pneumonia and patient had a low-grade fever with leukocytosis.  Continue IV ceftriaxone, Zithromax for now.  Follow cultures.  2.  Rib fracture-patient was noted to have a left-sided rib fracture.  Continue supportive care with pain control, incentive spirometry as tolerated.  3.  Essential hypertension-pressure slightly uncontrolled.  Continue Cardizem, lisinopril, hydralazine as needed.  4.  History of previous CVA-continue Plavix, aspirin.  5.  Depression-continue Celexa.  6.  Dementia with recent falls-await physical therapy evaluation.  Continue Seroquel.  7.  GERD-continue Protonix.    All the records are reviewed and case discussed with Care Management/Social Worker. Management plans discussed with the patient, family and they are in agreement.  CODE STATUS: DNR  DVT Prophylaxis: Lovenox  TOTAL TIME TAKING CARE OF THIS PATIENT: 30 minutes.   POSSIBLE D/C IN 1-2 DAYS, DEPENDING ON CLINICAL CONDITION.   Henreitta Leber M.D on 11/09/2017 at 1:33 PM  Between 7am to 6pm - Pager - [phone]  After 6pm go to  [email or web address] - Proofreader  [facility]  Office  [phone]  CC: Primary care physician; [name], NP

## 2017-11-10 LAB — CBC
HCT: 40.9 % (ref 40.0–52.0)
HEMOGLOBIN: 14.2 g/dL (ref 13.0–18.0)
MCH: 30.2 pg (ref 26.0–34.0)
MCHC: 34.7 g/dL (ref 32.0–36.0)
MCV: 87 fL (ref 80.0–100.0)
Platelets: 241 10*3/uL (ref 150–440)
RBC: 4.7 MIL/uL (ref 4.40–5.90)
RDW: 13.7 % (ref 11.5–14.5)
WBC: 9.3 10*3/uL (ref 3.8–10.6)

## 2017-11-10 LAB — EXPECTORATED SPUTUM ASSESSMENT W GRAM STAIN, RFLX TO RESP C

## 2017-11-10 LAB — BASIC METABOLIC PANEL
Anion gap: 7 (ref 5–15)
BUN: 22 mg/dL — ABNORMAL HIGH (ref 6–20)
CALCIUM: 8.5 mg/dL — AB (ref 8.9–10.3)
CHLORIDE: 105 mmol/L (ref 101–111)
CO2: 24 mmol/L (ref 22–32)
CREATININE: 1.02 mg/dL (ref 0.61–1.24)
Glucose, Bld: 148 mg/dL — ABNORMAL HIGH (ref 65–99)
Potassium: 3.6 mmol/L (ref 3.5–5.1)
SODIUM: 136 mmol/L (ref 135–145)

## 2017-11-10 LAB — LEGIONELLA PNEUMOPHILA SEROGP 1 UR AG: L. PNEUMOPHILA SEROGP 1 UR AG: NEGATIVE

## 2017-11-10 LAB — EXPECTORATED SPUTUM ASSESSMENT W REFEX TO RESP CULTURE

## 2017-11-10 MED ORDER — LEVOFLOXACIN 750 MG PO TABS: 750.0000 mg | ORAL_TABLET | Freq: Every day | ORAL | 0 refills | Status: AC

## 2017-11-10 NOTE — Care Management (Signed)
Patient for discharge home today.  CM spoke with son and agreeable to home health.  Agency preference [facility].  [name] (son) wishes to be the primary contact for home health visit scheduling [phone] (c) [phone] (h).  Obtained walker from [facility].  Patient usually go to the [facility] but at present unable to attend due to his walking difficulty.  Alvis Lemmings accepted referral and will make initial visit 4/8.

## 2017-11-10 NOTE — Discharge Summary (Signed)
[name] at [place] NAME: Charles Velasquez    MR#:  [id number]  DATE OF BIRTH:  October 27, 1927  DATE OF ADMISSION:  11/08/2017 ADMITTING PHYSICIAN: Gorden Harms, MD  DATE OF DISCHARGE: 11/10/2017  PRIMARY CARE PHYSICIAN: [name], NP    ADMISSION DIAGNOSIS:  Chest wall pain [R07.89] Community acquired pneumonia of right lung, unspecified part of lung [J18.9]  DISCHARGE DIAGNOSIS:  Active Problems:   CAP (community acquired pneumonia)   SECONDARY DIAGNOSIS:   Past Medical History:  Diagnosis Date  . Cancer ([place])   . Hypertension     HOSPITAL COURSE:   84 year old male with past medical history of dementia, previous CVA, hypertension, hyperlipidemia, history of peripheral vascular disease, coronary artery disease, GERD who presents to the hospital due to right-sided chest pain and suspected to have a pneumonia.  1.  Pneumonia-likely community acquired pneumonia. -Patient was treated with IV ceftriaxone, Zithromax while in the hospital.  He still has a cough with intermittent bloody sputum.  He is although not hypoxic, but is a poor historian given his underlying dementia.  Patient has been afebrile and stable and therefore being discharged on oral Levaquin for additional 5 days.  2.  Rib fracture-patient was noted to have a left-sided rib fracture.    Patient denies any pleuritic chest pain or any pain at all presently.  This was an incidental finding.  Continue supportive care for now.  3.  Essential hypertension- patient will continue Cardizem, lisinopril.  Blood pressure is stable presently.  4.  History of previous CVA- pt. Will continue Plavix, aspirin.  5.  Depression- pt. Will continue Celexa.  6.  Dementia with recent falls- patient was seen by physical therapy and the recommended home health services which is being arranged for the patient.   DISCHARGE CONDITIONS:   Stable  CONSULTS OBTAINED:    DRUG  ALLERGIES:  No Known Allergies  DISCHARGE MEDICATIONS:   Allergies as of 11/10/2017   No Known Allergies     Medication List    TAKE these medications   aspirin EC 81 MG tablet Take 81 mg by mouth daily.   cetirizine 10 MG tablet Commonly known as:  ZYRTEC TAKE 1 TABLET BY MOUTH DAILY   citalopram 20 MG tablet Commonly known as:  CELEXA TAKE 1 TABLET BY MOUTH DAILY   clopidogrel 75 MG tablet Commonly known as:  PLAVIX Take 75 mg by mouth daily.   diltiazem 240 MG 24 hr capsule Commonly known as:  DILACOR XR Take 240 mg by mouth daily.   fluticasone 50 MCG/ACT nasal spray Commonly known as:  FLONASE Place 2 sprays into both nostrils daily.   levofloxacin 750 MG tablet Commonly known as:  LEVAQUIN Take 1 tablet (750 mg total) by mouth daily for 5 days.   lisinopril 20 MG tablet Commonly known as:  PRINIVIL,ZESTRIL Take 20 mg by mouth daily.            Durable Medical Equipment  (From admission, onward)        Start     Ordered   11/10/17 1259  For home use only DME Walker rolling  Once    Question:  Patient needs a walker to treat with the following condition  Answer:  Dementia   11/10/17 1258        DISCHARGE INSTRUCTIONS:   DIET:  Cardiac diet  DISCHARGE CONDITION:  Stable  ACTIVITY:  Activity as tolerated  OXYGEN:  Home Oxygen: No.  Oxygen Delivery: room air  DISCHARGE LOCATION:  Assisted Living with [name].    If you experience worsening of your admission symptoms, develop shortness of breath, life threatening emergency, suicidal or homicidal thoughts you must seek medical attention immediately by calling 911 or calling your MD immediately  if symptoms less severe.  You Must read complete instructions/literature along with all the possible adverse reactions/side effects for all the Medicines you take and that have been prescribed to you. Take any new Medicines after you have completely understood and accpet all the  possible adverse reactions/side effects.   Please note  You were cared for by a hospitalist during your hospital stay. If you have any questions about your discharge medications or the care you received while you were in the hospital after you are discharged, you can call the unit and asked to speak with the hospitalist on call if the hospitalist that took care of you is not available. Once you are discharged, your primary care physician will handle any further medical issues. Please note that NO REFILLS for any discharge medications will be authorized once you are discharged, as it is imperative that you return to your primary care physician (or establish a relationship with a primary care physician if you do not have one) for your aftercare needs so that they can reassess your need for medications and monitor your lab values.     Today   Mental status much improved. Wife and son at bedside.  Pt. Denies any complaints. Will d/c home with Home Health services today.   VITAL SIGNS:  Blood pressure (!) 180/84, pulse 71, temperature 98.5 F (36.9 C), temperature source Oral, resp. rate 16, height 5\' 9"  (1.753 m), weight 101.6 kg (224 lb), SpO2 94 %.  I/O:    Intake/Output Summary (Last 24 hours) at 11/10/2017 1358 Last data filed at 11/10/2017 0650 Gross per 24 hour  Intake 350 ml  Output 390 ml  Net -40 ml    PHYSICAL EXAMINATION:   GENERAL:  84 y.o.-year-old patient lying in bed lethargic but in NAD.  EYES: Pupils equal, round, reactive to light and accommodation. No scleral icterus. Extraocular muscles intact.  HEENT: Head atraumatic, normocephalic. Oropharynx and nasopharynx clear.  NECK:  Supple, no jugular venous distention. No thyroid enlargement, no tenderness.  LUNGS: Normal breath sounds bilaterally, no wheezing, rales, rhonchi. No use of accessory muscles of respiration.  CARDIOVASCULAR: S1, S2 normal. No murmurs, rubs, or gallops.  ABDOMEN: Soft, nontender, nondistended.  Bowel sounds present. No organomegaly or mass.  EXTREMITIES: No cyanosis, clubbing or edema b/l.    NEUROLOGIC: Cranial nerves II through XII are intact. No focal Motor or sensory deficits b/l. Globally weak. PSYCHIATRIC: The patient is alert and oriented x 2.  SKIN: No obvious rash, lesion, or ulcer.     DATA REVIEW:   CBC Recent Labs  Lab 11/10/17 0451  WBC 9.3  HGB 14.2  HCT 40.9  PLT 241    Chemistries  Recent Labs  Lab 11/08/17 1812 11/10/17 0451  NA 135 136  K 4.2 3.6  CL 101 105  CO2 26 24  GLUCOSE 196* 148*  BUN 20 22*  CREATININE 1.03 1.02  CALCIUM 9.0 8.5*  AST 18  --   ALT 23  --   ALKPHOS 65  --   BILITOT 1.1  --     Cardiac Enzymes Recent Labs  Lab 11/08/17 1812  TROPONINI 0.03*    Microbiology Results  Results for orders  placed or performed during the hospital encounter of 11/08/17  Culture, blood (routine x 2)     Status: None (Preliminary result)   Collection Time: 11/08/17  8:16 PM  Result Value Ref Range Status   Specimen Description BLOOD RIGHT ANTECUBITAL  Final   Special Requests   Final    BOTTLES DRAWN AEROBIC AND ANAEROBIC Blood Culture results may not be optimal due to an excessive volume of blood received in culture bottles   Culture  Setup Time   Final    GRAM POSITIVE COCCI IN CLUSTERS ANAEROBIC BOTTLE ONLY CRITICAL VALUE NOTED.  VALUE IS CONSISTENT WITH PREVIOUSLY REPORTED AND CALLED VALUE. Performed at [facility], [address] [place], [place] [address]    Culture GRAM POSITIVE COCCI IN CLUSTERS  Final   Report Status PENDING  Incomplete  Culture, blood (routine x 2)     Status: None (Preliminary result)   Collection Time: 11/08/17  8:16 PM  Result Value Ref Range Status   Specimen Description   Final    BLOOD LEFT ANTECUBITAL Performed at [facility], [address] [place], [place] [address]    Special Requests   Final    BOTTLES DRAWN AEROBIC AND ANAEROBIC Blood Culture adequate  volume Performed at [facility], [address] [place], [place] [address]    Culture  Setup Time   Final    GRAM POSITIVE COCCI ANAEROBIC BOTTLE ONLY CRITICAL RESULT CALLED TO, READ BACK BY AND VERIFIED WITH: [place] 11/09/17.PMH    Culture   Final    GRAM POSITIVE COCCI TOO YOUNG TO READ Performed at [facility], [place] [address], [place], [place] [address]    Report Status PENDING  Incomplete  Blood Culture ID Panel (Reflexed)     Status: Abnormal   Collection Time: 11/08/17  8:16 PM  Result Value Ref Range Status   Enterococcus species NOT DETECTED NOT DETECTED Final   Listeria monocytogenes NOT DETECTED NOT DETECTED Final   Staphylococcus species DETECTED (A) NOT DETECTED Final    Comment: Methicillin (oxacillin) susceptible coagulase negative staphylococcus. Possible blood culture contaminant (unless isolated from more than one blood culture draw or clinical case suggests pathogenicity). No antibiotic treatment is indicated for blood  culture contaminants. CRITICAL RESULT CALLED TO, READ BACK BY AND VERIFIED WITH: [place] 11/09/17.PMH    Staphylococcus aureus NOT DETECTED NOT DETECTED Final   Methicillin resistance NOT DETECTED NOT DETECTED Final   Streptococcus species NOT DETECTED NOT DETECTED Final   Streptococcus agalactiae NOT DETECTED NOT DETECTED Final   Streptococcus pneumoniae NOT DETECTED NOT DETECTED Final   Streptococcus pyogenes NOT DETECTED NOT DETECTED Final   Acinetobacter baumannii NOT DETECTED NOT DETECTED Final   Enterobacteriaceae species NOT DETECTED NOT DETECTED Final   Enterobacter cloacae complex NOT DETECTED NOT DETECTED Final   Escherichia coli NOT DETECTED NOT DETECTED Final   Klebsiella oxytoca NOT DETECTED NOT DETECTED Final   Klebsiella pneumoniae NOT DETECTED NOT DETECTED Final   Proteus species NOT DETECTED NOT DETECTED Final   Serratia marcescens NOT DETECTED NOT DETECTED Final   Haemophilus  influenzae NOT DETECTED NOT DETECTED Final   Neisseria meningitidis NOT DETECTED NOT DETECTED Final   Pseudomonas aeruginosa NOT DETECTED NOT DETECTED Final   Candida albicans NOT DETECTED NOT DETECTED Final   Candida glabrata NOT DETECTED NOT DETECTED Final   Candida krusei NOT DETECTED NOT DETECTED Final   Candida parapsilosis NOT DETECTED NOT DETECTED Final  Candida tropicalis NOT DETECTED NOT DETECTED Final    Comment: Performed at [facility], [address] [place], [place] [address]    RADIOLOGY:  Dg Chest 2 View  Result Date: 11/08/2017 CLINICAL DATA:  Right-sided chest pain over the last day.  Dementia. EXAM: CHEST - 2 VIEW COMPARISON:  03/27/2013 FINDINGS: Heart size is normal. Chronic aortic atherosclerosis. Possible venous hypertension with early interstitial edema. Some volume loss at the right base which could be atelectasis or early pneumonia. Question right lateral rib fracture, not definite. IMPRESSION: Right lung base atelectasis and or infiltrate. Question right lateral rib fracture. Possible venous hypertension and early edema. Electronically Signed   By: Nelson Chimes M.D.   On: 11/08/2017 18:50   Ct Head Wo Contrast  Result Date: 11/08/2017 CLINICAL DATA:  Right-sided headache.  Dementia. EXAM: CT HEAD WITHOUT CONTRAST TECHNIQUE: Contiguous axial images were obtained from the base of the skull through the vertex without intravenous contrast. COMPARISON:  06/27/2016 FINDINGS: Brain: Generalized atrophy. Chronic small-vessel ischemic changes of the white matter. 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 No mass lesion, hemorrhage, hydrocephalus or extra-axial collection. Vascular: There is atherosclerotic calcification of the major vessels at the base of the brain. Skull: Negative Sinuses/Orbits: Clear/normal Other: None IMPRESSION: Atrophy and chronic small-vessel ischemic changes. No acute or reversible finding. Electronically Signed   By: Nelson Chimes M.D.   On:  11/08/2017 19:19   Ct Renal Stone Study  Result Date: 11/08/2017 CLINICAL DATA:  Right-sided abdominal pain.  Dementia. EXAM: CT ABDOMEN AND PELVIS WITHOUT CONTRAST TECHNIQUE: Multidetector CT imaging of the abdomen and pelvis was performed following the standard protocol without IV contrast. COMPARISON:  None. FINDINGS: Lower chest: Indistinct airspace opacity in the right lower lobe suspicious for pneumonia. Bandlike opacities in the lingula and right middle lobe favoring atelectasis. Small right pleural effusion. Coronary atherosclerosis. Calcified aortic valve. Mild cardiomegaly. Hepatobiliary: Diffuse hepatic steatosis. Pancreas: Unremarkable Spleen: Unremarkable Adrenals/Urinary Tract: Vascular calcifications in both renal hila. Scarring in portions of the left kidney. Two adjacent bladder calculi are present, one 3 mm and one 2 mm in diameter on image 91/5. No other urinary tract calculi are observed. Stomach/Bowel: Small type 1 hiatal hernia. Otherwise unremarkable. Appendix not well visualized but I do not observe an inflammatory process in the right lower quadrant. Vascular/Lymphatic: Aortoiliac atherosclerotic vascular disease. No pathologic adenopathy identified. Reproductive: Prostatectomy. Dense metal clips are present in the lower pelvis likely from prior lymph node dissection. Other: No supplemental non-categorized findings. Musculoskeletal: Bridging spurring of the left sacroiliac joint. No compelling findings for prostate osseous metastatic disease. Lower thoracic and lumbar spondylosis. Fatty calcification in both rectus femoris muscles. IMPRESSION: 1. Right lower lobe airspace opacity is nonspecific but could certainly represent pneumonia. Small right pleural effusion. 2. Nonvisualization of the appendix, although no right lower quadrant or pericecal inflammatory process is identified. 3. Two small bladder calculi are present. 4. Other imaging findings of potential clinical significance:  Athero coronary atherosclerosis. Mild cardiomegaly. Calcified aortic valve. Diffuse hepatic steatosis. Scarring in the left kidney. Small type 1 hiatal hernia. Prostatectomy with pelvic lymph node dissection in the past. Electronically Signed   By: Van Clines M.D.   On: 11/08/2017 18:31      Management plans discussed with the patient, family and they are in agreement.  CODE STATUS:     Code Status Orders  (From admission, onward)        Start     Ordered   11/08/17 2030  Do not  attempt resuscitation (DNR)  Continuous    Question Answer Comment  In the event of cardiac or respiratory ARREST Do not call a "code blue"   In the event of cardiac or respiratory ARREST Do not perform Intubation, CPR, defibrillation or ACLS   In the event of cardiac or respiratory ARREST Use medication by any route, position, wound care, and other measures to relive pain and suffering. May use oxygen, suction and manual treatment of airway obstruction as needed for comfort.   Comments nurse may pronounce      11/08/17 2029    TOTAL TIME TAKING CARE OF THIS PATIENT: 40 minutes.    Henreitta Leber M.D on 11/10/2017 at 1:58 PM  Between 7am to 6pm - Pager - [phone]  After 6pm go to [email or web address] - Proofreader  [facility]  Office  [phone]  CC: Primary care physician; [name], NP

## 2017-11-12 LAB — CULTURE, BLOOD (ROUTINE X 2): Special Requests: ADEQUATE

## 2017-11-13 LAB — CULTURE, RESPIRATORY W GRAM STAIN: Culture: NORMAL

## 2017-11-26 ENCOUNTER — Encounter: Payer: Self-pay | Admitting: *Deleted

## 2017-11-26 ENCOUNTER — Emergency Department
Admission: EM | Admit: 2017-11-26 | Discharge: 2017-11-27 | Disposition: A | Discharge status: Home / Self Care | Payer: Medicare Other | Attending: Emergency Medicine | Admitting: Emergency Medicine

## 2017-11-26 ENCOUNTER — Emergency Department: Payer: Medicare Other

## 2017-11-26 ENCOUNTER — Other Ambulatory Visit: Payer: Self-pay

## 2017-11-26 DIAGNOSIS — Z7902 Long term (current) use of antithrombotics/antiplatelets: Secondary | ICD-10-CM | POA: Insufficient documentation

## 2017-11-26 DIAGNOSIS — Z7982 Long term (current) use of aspirin: Secondary | ICD-10-CM | POA: Diagnosis not present

## 2017-11-26 DIAGNOSIS — I2510 Atherosclerotic heart disease of native coronary artery without angina pectoris: Secondary | ICD-10-CM | POA: Diagnosis not present

## 2017-11-26 DIAGNOSIS — I10 Essential (primary) hypertension: Secondary | ICD-10-CM | POA: Diagnosis present

## 2017-11-26 DIAGNOSIS — F0390 Unspecified dementia without behavioral disturbance: Secondary | ICD-10-CM | POA: Diagnosis not present

## 2017-11-26 DIAGNOSIS — R011 Cardiac murmur, unspecified: Secondary | ICD-10-CM | POA: Insufficient documentation

## 2017-11-26 DIAGNOSIS — Z859 Personal history of malignant neoplasm, unspecified: Secondary | ICD-10-CM | POA: Insufficient documentation

## 2017-11-26 DIAGNOSIS — Z79899 Other long term (current) drug therapy: Secondary | ICD-10-CM | POA: Diagnosis not present

## 2017-11-26 DIAGNOSIS — I679 Cerebrovascular disease, unspecified: Secondary | ICD-10-CM | POA: Diagnosis not present

## 2017-11-26 DIAGNOSIS — I6930 Unspecified sequelae of cerebral infarction: Secondary | ICD-10-CM

## 2017-11-26 LAB — CBC
HCT: 45.3 % (ref 40.0–52.0)
HEMOGLOBIN: 15.2 g/dL (ref 13.0–18.0)
MCH: 29.6 pg (ref 26.0–34.0)
MCHC: 33.6 g/dL (ref 32.0–36.0)
MCV: 88 fL (ref 80.0–100.0)
PLATELETS: 367 10*3/uL (ref 150–440)
RBC: 5.15 MIL/uL (ref 4.40–5.90)
RDW: 13.7 % (ref 11.5–14.5)
WBC: 7.5 10*3/uL (ref 3.8–10.6)

## 2017-11-26 LAB — BASIC METABOLIC PANEL
ANION GAP: 7 (ref 5–15)
BUN: 16 mg/dL (ref 6–20)
CALCIUM: 9.3 mg/dL (ref 8.9–10.3)
CO2: 29 mmol/L (ref 22–32)
CREATININE: 0.93 mg/dL (ref 0.61–1.24)
Chloride: 99 mmol/L — ABNORMAL LOW (ref 101–111)
Glucose, Bld: 145 mg/dL — ABNORMAL HIGH (ref 65–99)
Potassium: 4 mmol/L (ref 3.5–5.1)
SODIUM: 135 mmol/L (ref 135–145)

## 2017-11-26 LAB — TROPONIN I: Troponin I: 0.03 ng/mL (ref <0.03)

## 2017-11-26 MED ORDER — AMLODIPINE BESYLATE 5 MG PO TABS
5.0000 mg | ORAL_TABLET | Freq: Once | ORAL | Status: AC
  Administered 2017-11-26: 5 mg via ORAL
  Filled 2017-11-26: qty 1

## 2017-11-26 NOTE — ED Triage Notes (Signed)
Pt brought in via ems from with elevated blood pressure today.  Hx dementia.  Pt denies pain  Pt alert.  Speech clear.

## 2017-11-27 MED ORDER — AMLODIPINE BESYLATE 5 MG PO TABS: 5.0000 mg | ORAL_TABLET | Freq: Every day | ORAL | 0 refills | Status: AC

## 2017-11-27 NOTE — ED Provider Notes (Signed)
-----------------------------------------   1:06 AM on 11/27/2017 -----------------------------------------  Signed out to watch the patient and see if his blood pressure came down below 414 systolic.  It is now 189/61 and the patient and family are eager to go.  I think that is appropriate and he is currently asymptomatic.  I gave them the discharge instructions prepared by Dr. Sullivan Lone.   Hinda Kehr, MD 11/27/17 [id number]

## 2017-11-27 NOTE — ED Provider Notes (Signed)
[facility] Emergency Department Provider Note  ____________________________________________  Time seen: Approximately 12:15 AM  I have reviewed the triage vital signs and the nursing notes.   HISTORY  Chief Complaint Hypertension  Level 5 caveat:  Portions of the history and physical were unable to be obtained due to dementia   HPI Charles Velasquez is a 84 y.o. male with a history of hypertension, CAD on plavix and ASA, dementia who presents for evaluation of elevated blood pressure.  According to the family patient has been having elevated blood pressure since leaving the hospital 2 weeks ago.  Today they gave him an extra lisinopril and patient continued to have BP above 200.  He is completely asymptomatic from it.  He denies chest pain, dizziness, headache, facial droop, slurred speech, unilateral weakness or numbness.  According to the family patient is at his baseline at this time.  He has been compliant with his medications.   Past Medical History:  Diagnosis Date  . Cancer ([place])   . Hypertension     Patient Active Problem List   Diagnosis Date Noted  . CAP (community acquired pneumonia) 11/08/2017  . Hypertension 03/05/2017  . Carotid stenosis 03/05/2017  . PAD (peripheral artery disease) ([place]) 03/05/2017    No past surgical history on file.  Prior to Admission medications   Medication Sig Start Date End Date Taking? Authorizing Provider  aspirin EC 81 MG tablet Take 81 mg by mouth daily.    Yes [provider]  cetirizine (ZYRTEC) 10 MG tablet TAKE 1 TABLET BY MOUTH DAILY 01/18/17  Yes [provider]  citalopram (CELEXA) 20 MG tablet TAKE 1 TABLET BY MOUTH DAILY 08/27/16  Yes [provider]  clopidogrel (PLAVIX) 75 MG tablet Take 75 mg by mouth daily.    Yes [provider]  diltiazem (DILACOR XR) 240 MG 24 hr capsule Take 240 mg by mouth daily.  07/11/10  Yes [provider]  fluticasone  (FLONASE) 50 MCG/ACT nasal spray Place 2 sprays into both nostrils daily.   Yes [provider]  lisinopril (PRINIVIL,ZESTRIL) 20 MG tablet Take 20 mg by mouth daily.   Yes [provider]  amLODipine (NORVASC) 5 MG tablet Take 1 tablet (5 mg total) by mouth daily. 11/27/17 11/27/18  Rudene Re, MD    Allergies Patient has no known allergies.  No family history on file.  Social History Social History   Tobacco Use  . Smoking status: Never Smoker  . Smokeless tobacco: Never Used  Substance Use Topics  . Alcohol use: Yes  . Drug use: Never    Review of Systems  Constitutional: Negative for fever. Eyes: Negative for visual changes. ENT: Negative for sore throat. Neck: No neck pain  Cardiovascular: Negative for chest pain. Respiratory: Negative for shortness of breath. Gastrointestinal: Negative for abdominal pain, vomiting or diarrhea. Genitourinary: Negative for dysuria. Musculoskeletal: Negative for back pain. Skin: Negative for rash. Neurological: Negative for headaches, weakness or numbness. Psych: No SI or HI  ____________________________________________   PHYSICAL EXAM:  VITAL SIGNS: ED Triage Vitals  Enc Vitals Group     BP 11/26/17 2128 (!) 226/80     Pulse Rate 11/26/17 2128 68     Resp 11/26/17 2128 18     Temp 11/26/17 2128 (!) 97.1 F (36.2 C)     Temp Source 11/26/17 2128 Oral     SpO2 11/26/17 2128 95 %     Weight 11/26/17 2125 224 lb (101.6 kg)  Height 11/26/17 2125 5\' 9"  (1.753 m)     Head Circumference --      Peak Flow --      Pain Score --      Pain Loc --      Pain Edu? --      Excl. in [place]? --     Constitutional: Alert and oriented. Well appearing and in no apparent distress. HEENT:      Head: Normocephalic and atraumatic.         Eyes: Conjunctivae are normal. Sclera is non-icteric. Asymmetric pupils with R>L      Mouth/Throat: Mucous membranes are moist.       Neck: Supple with no signs of  meningismus. Cardiovascular: Regular rate and rhythm. II/VI systolic murmur loudest at the RUSB. 2+ symmetrical distal pulses are present in all extremities. No JVD. Respiratory: Normal respiratory effort. Lungs are clear to auscultation bilaterally. No wheezes, crackles, or rhonchi.  Gastrointestinal: Soft, non tender, and non distended with positive bowel sounds. No rebound or guarding. Musculoskeletal: Nontender with normal range of motion in all extremities. No edema, cyanosis, or erythema of extremities. Neurologic: Normal speech and language. Face is symmetric. Moving all extremities.  Intact strength and sensation, no pronator drift, no dysmetria  skin: Skin is warm, dry and intact. No rash noted. Psychiatric: Mood and affect are normal. Speech and behavior are normal.  ____________________________________________   LABS (all labs ordered are listed, but only abnormal results are displayed)  Labs Reviewed  BASIC METABOLIC PANEL - Abnormal; Notable for the following components:      Result Value   Chloride 99 (*)    Glucose, Bld 145 (*)    All other components within normal limits  TROPONIN I - Abnormal; Notable for the following components:   Troponin I 0.03 (*)    All other components within normal limits  CBC   ____________________________________________  EKG  ED ECG REPORT I, Rudene Re, the attending physician, personally viewed and interpreted this ECG.  Normal sinus rhythm, rate of 68, first-degree AV block, normal QRS and QTC, normal axis, no ST elevations or depressions. ____________________________________________  RADIOLOGY  I have personally reviewed the images performed during this visit and I agree with the Radiologist's read.   Interpretation by Radiologist:  Dg Chest 2 View  Result Date: 11/26/2017 CLINICAL DATA:  Hypertension EXAM: CHEST - 2 VIEW COMPARISON:  11/08/2017 FINDINGS: Top-normal heart size with moderate aortic atherosclerosis.  Hyperinflated lungs with subsegmental atelectasis and/or scarring at the right lung base. Partial resolution of right basilar atelectasis and/or infiltrate. No effusion or pneumothorax. No overt pulmonary edema. No acute pulmonary consolidation. No dominant mass. Osteoarthritis of the AC joints. IMPRESSION: Pulmonary hyperinflation. Partial resolution of right basilar pneumonia some residual atelectatic change present. Aortic atherosclerosis without aneurysm. Electronically Signed   By: Ashley Royalty M.D.   On: 11/26/2017 22:19   Ct Head Wo Contrast  Result Date: 11/26/2017 CLINICAL DATA:  Elevated blood pressure today. EXAM: CT HEAD WITHOUT CONTRAST TECHNIQUE: Contiguous axial images were obtained from the base of the skull through the vertex without intravenous contrast. COMPARISON:  11/08/2017 FINDINGS: BRAIN: There is sulcal and ventricular prominence consistent with superficial and central atrophy. No intraparenchymal hemorrhage, mass effect nor midline shift. Remote left occipital lobe infarct with encephalomalacia. Periventricular and subcortical white matter hypodensities consistent with chronic small vessel ischemic disease are identified. No acute large vascular territory infarcts. No abnormal extra-axial fluid collections. Basal cisterns are not effaced and midline. VASCULAR: Moderate calcific  atherosclerosis of the carotid siphons. SKULL: No skull fracture. No significant scalp soft tissue swelling. SINUSES/ORBITS: The mastoid air-cells are clear. The included paranasal sinuses are well-aerated.The included ocular globes and orbital contents are non-suspicious. OTHER: None. IMPRESSION: 1. Remote left occipital lobe infarct. 2. Chronic small vessel ischemic disease. 3. No acute intracranial abnormality. 4. Stable atrophy. Electronically Signed   By: Ashley Royalty M.D.   On: 11/26/2017 22:21     ____________________________________________   PROCEDURES  Procedure(s) performed:  None Procedures Critical Care performed:  None ____________________________________________   INITIAL IMPRESSION / ASSESSMENT AND PLAN / ED COURSE   84 y.o. male with a history of hypertension, CAD on plavix and ASA, dementia who presents for evaluation of elevated blood pressure.  Patient is completely asymptomatic.  Review of medical records shows the patient's blood pressure has been elevated for several months.  He is currently on lisinopril and Cardizem and has been compliant with it.  EKG with no evidence of ischemia or dysrhythmia.  Labs with no evidence of endorgan damage.  Patient did have asymmetric pupils on exam which according to the family they had never noticed before so I sent him for a CT head which shows a chronic occipital lobe stroke.  Patient is on Plavix and aspirin.  Recommended close follow-up with primary care doctor for further evaluation including echo and carotid ultrasound.  Patient does have a murmur on exam which according to the family is also new.  Follow-up with cardiology for an echocardiogram.  Patient will be started on amlodipine with plan to discharge home on lisinopril, amlodipine, and Cardizem.  Close follow-up with primary care doctor for reevaluation.  Discussed signs and symptoms of stroke or heart attack and recommended return to the emergency room if these develop. Patient has just received amlodipine. Care transferred to Dr. Karma Greaser      As part of my medical decision making, I reviewed the following data within the [place] History obtained from family, Nursing notes reviewed and incorporated, Labs reviewed , EKG interpreted , Old chart reviewed, Radiograph reviewed , Notes from prior ED visits and [place] Controlled Substance Database    Pertinent labs & imaging results that were available during my care of the patient were reviewed by me and considered in my medical decision making (see chart for  details).    ____________________________________________   FINAL CLINICAL IMPRESSION(S) / ED DIAGNOSES  Final diagnoses:  Essential hypertension  Chronic cerebrovascular accident (CVA)  Cardiac murmur      NEW MEDICATIONS STARTED DURING THIS VISIT:  ED Discharge Orders        Ordered    amLODipine (NORVASC) 5 MG tablet  Daily     11/27/17 0023       Note:  This document was prepared using Dragon voice recognition software and may include unintentional dictation errors.    Alfred Levins, [place], MD 11/27/17 [phone]

## 2017-11-27 NOTE — ED Notes (Signed)
Patient to lobby in wheelchair. Patient's family verbalized understanding of new medication and follow-up care.

## 2018-07-10 LAB — HIV ANTIBODY (ROUTINE TESTING W REFLEX): HIV Screen 4th Generation wRfx: NONREACTIVE

## 2018-07-20 ENCOUNTER — Other Ambulatory Visit: Payer: Self-pay

## 2018-07-20 ENCOUNTER — Encounter: Payer: Self-pay | Admitting: Emergency Medicine

## 2018-07-20 ENCOUNTER — Inpatient Hospital Stay
Admission: EM | Admit: 2018-07-20 | Discharge: 2018-07-22 | DRG: 193 | Disposition: A | Discharge status: Home Health | Payer: Medicare Other | Attending: Internal Medicine | Admitting: Internal Medicine

## 2018-07-20 ENCOUNTER — Emergency Department: Payer: Medicare Other

## 2018-07-20 DIAGNOSIS — R4182 Altered mental status, unspecified: Secondary | ICD-10-CM | POA: Diagnosis not present

## 2018-07-20 DIAGNOSIS — J1000 Influenza due to other identified influenza virus with unspecified type of pneumonia: Principal | ICD-10-CM | POA: Diagnosis present

## 2018-07-20 DIAGNOSIS — F0390 Unspecified dementia without behavioral disturbance: Secondary | ICD-10-CM | POA: Diagnosis present

## 2018-07-20 DIAGNOSIS — J189 Pneumonia, unspecified organism: Secondary | ICD-10-CM | POA: Diagnosis present

## 2018-07-20 DIAGNOSIS — Z79899 Other long term (current) drug therapy: Secondary | ICD-10-CM | POA: Diagnosis not present

## 2018-07-20 DIAGNOSIS — Z66 Do not resuscitate: Secondary | ICD-10-CM | POA: Diagnosis present

## 2018-07-20 DIAGNOSIS — Z7982 Long term (current) use of aspirin: Secondary | ICD-10-CM

## 2018-07-20 DIAGNOSIS — Z7902 Long term (current) use of antithrombotics/antiplatelets: Secondary | ICD-10-CM | POA: Diagnosis not present

## 2018-07-20 DIAGNOSIS — Z7951 Long term (current) use of inhaled steroids: Secondary | ICD-10-CM | POA: Diagnosis not present

## 2018-07-20 DIAGNOSIS — J101 Influenza due to other identified influenza virus with other respiratory manifestations: Secondary | ICD-10-CM

## 2018-07-20 DIAGNOSIS — I10 Essential (primary) hypertension: Secondary | ICD-10-CM | POA: Diagnosis present

## 2018-07-20 DIAGNOSIS — J9601 Acute respiratory failure with hypoxia: Secondary | ICD-10-CM | POA: Diagnosis present

## 2018-07-20 HISTORY — DX: Unspecified dementia, unspecified severity, without behavioral disturbance, psychotic disturbance, mood disturbance, and anxiety: F03.90

## 2018-07-20 LAB — URINALYSIS, COMPLETE (UACMP) WITH MICROSCOPIC
BACTERIA UA: NONE SEEN
BILIRUBIN URINE: NEGATIVE
Glucose, UA: NEGATIVE mg/dL
HGB URINE DIPSTICK: NEGATIVE
KETONES UR: 5 mg/dL — AB
LEUKOCYTES UA: NEGATIVE
NITRITE: NEGATIVE
PROTEIN: NEGATIVE mg/dL
Specific Gravity, Urine: 1.017 (ref 1.005–1.030)
pH: 6 (ref 5.0–8.0)

## 2018-07-20 LAB — COMPREHENSIVE METABOLIC PANEL
ALBUMIN: 4 g/dL (ref 3.5–5.0)
ALK PHOS: 54 U/L (ref 38–126)
ALT: 33 U/L (ref 0–44)
ANION GAP: 8 (ref 5–15)
AST: 26 U/L (ref 15–41)
BUN: 21 mg/dL (ref 8–23)
CALCIUM: 8.7 mg/dL — AB (ref 8.9–10.3)
CHLORIDE: 100 mmol/L (ref 98–111)
CO2: 26 mmol/L (ref 22–32)
Creatinine, Ser: 1.04 mg/dL (ref 0.61–1.24)
GFR calc non Af Amer: >60 mL/min (ref >60)
Glucose, Bld: 145 mg/dL — ABNORMAL HIGH (ref 70–99)
Potassium: 3.8 mmol/L (ref 3.5–5.1)
SODIUM: 134 mmol/L — AB (ref 135–145)
Total Bilirubin: 0.7 mg/dL (ref 0.3–1.2)
Total Protein: 7.1 g/dL (ref 6.5–8.1)

## 2018-07-20 LAB — CBC WITH DIFFERENTIAL/PLATELET
ABS IMMATURE GRANULOCYTES: 0.02 10*3/uL (ref 0.00–0.07)
BASOS ABS: 0 10*3/uL (ref 0.0–0.1)
BASOS PCT: 0 %
EOS PCT: 1 %
Eosinophils Absolute: 0.1 10*3/uL (ref 0.0–0.5)
HCT: 43.7 % (ref 39.0–52.0)
HEMOGLOBIN: 14.4 g/dL (ref 13.0–17.0)
Immature Granulocytes: 0 %
LYMPHS PCT: 23 %
Lymphs Abs: 1.3 10*3/uL (ref 0.7–4.0)
MCH: 29.6 pg (ref 26.0–34.0)
MCHC: 33 g/dL (ref 30.0–36.0)
MCV: 89.9 fL (ref 80.0–100.0)
Monocytes Absolute: 1.1 10*3/uL — ABNORMAL HIGH (ref 0.1–1.0)
Monocytes Relative: 20 %
NEUTROS ABS: 2.9 10*3/uL (ref 1.7–7.7)
NRBC: 0 % (ref 0.0–0.2)
Neutrophils Relative %: 56 %
PLATELETS: 216 10*3/uL (ref 150–400)
RBC: 4.86 MIL/uL (ref 4.22–5.81)
RDW: 13 % (ref 11.5–15.5)
WBC: 5.4 10*3/uL (ref 4.0–10.5)

## 2018-07-20 LAB — LACTIC ACID, PLASMA: Lactic Acid, Venous: 1 mmol/L (ref 0.5–1.9)

## 2018-07-20 LAB — INFLUENZA PANEL BY PCR (TYPE A & B)
INFLAPCR: POSITIVE — AB
INFLBPCR: NEGATIVE

## 2018-07-20 MED ORDER — SODIUM CHLORIDE 0.9 % IV SOLN
INTRAVENOUS | Status: DC
  Administered 2018-07-20: 15:00:00 via INTRAVENOUS

## 2018-07-20 MED ORDER — ACETAMINOPHEN 325 MG PO TABS: 650.0000 mg | ORAL_TABLET | Freq: Four times a day (QID) | ORAL | Status: DC | PRN

## 2018-07-20 MED ORDER — FLUTICASONE PROPIONATE 50 MCG/ACT NA SUSP
2.0000 | Freq: Every day | NASAL | Status: DC
  Filled 2018-07-20 (×2): qty 16

## 2018-07-20 MED ORDER — ASPIRIN EC 81 MG PO TBEC
81.0000 mg | DELAYED_RELEASE_TABLET | Freq: Every day | ORAL | Status: DC
  Administered 2018-07-21 – 2018-07-22 (×2): 81 mg via ORAL
  Filled 2018-07-20 (×2): qty 1

## 2018-07-20 MED ORDER — IPRATROPIUM-ALBUTEROL 0.5-2.5 (3) MG/3ML IN SOLN
3.0000 mL | Freq: Once | RESPIRATORY_TRACT | Status: AC
  Administered 2018-07-20: 3 mL via RESPIRATORY_TRACT
  Filled 2018-07-20: qty 3

## 2018-07-20 MED ORDER — ACETAMINOPHEN 650 MG RE SUPP: 650.0000 mg | Freq: Four times a day (QID) | RECTAL | Status: DC | PRN

## 2018-07-20 MED ORDER — ONDANSETRON HCL 4 MG/2ML IJ SOLN: 4.0000 mg | Freq: Four times a day (QID) | INTRAMUSCULAR | Status: DC | PRN

## 2018-07-20 MED ORDER — OSELTAMIVIR PHOSPHATE 75 MG PO CAPS
75.0000 mg | ORAL_CAPSULE | Freq: Once | ORAL | Status: DC
  Filled 2018-07-20: qty 1

## 2018-07-20 MED ORDER — ENOXAPARIN SODIUM 40 MG/0.4ML ~~LOC~~ SOLN
40.0000 mg | SUBCUTANEOUS | Status: DC
  Administered 2018-07-20 – 2018-07-21 (×2): 40 mg via SUBCUTANEOUS
  Filled 2018-07-20 (×2): qty 0.4

## 2018-07-20 MED ORDER — SODIUM CHLORIDE 0.9 % IV SOLN: 250.0000 mL | INTRAVENOUS | Status: DC | PRN

## 2018-07-20 MED ORDER — CITALOPRAM HYDROBROMIDE 20 MG PO TABS
20.0000 mg | ORAL_TABLET | Freq: Every day | ORAL | Status: DC
  Administered 2018-07-20 – 2018-07-22 (×3): 20 mg via ORAL
  Filled 2018-07-20 (×3): qty 1

## 2018-07-20 MED ORDER — SODIUM CHLORIDE 0.9 % IV SOLN: 500.0000 mg | INTRAVENOUS | Status: DC

## 2018-07-20 MED ORDER — LORATADINE 10 MG PO TABS
10.0000 mg | ORAL_TABLET | Freq: Every day | ORAL | Status: DC
  Administered 2018-07-21 – 2018-07-22 (×2): 10 mg via ORAL
  Filled 2018-07-20 (×2): qty 1

## 2018-07-20 MED ORDER — LORAZEPAM 2 MG/ML IJ SOLN
1.0000 mg | Freq: Once | INTRAMUSCULAR | Status: AC
  Administered 2018-07-20: 1 mg via INTRAVENOUS
  Filled 2018-07-20: qty 1

## 2018-07-20 MED ORDER — SODIUM CHLORIDE 0.9 % IV SOLN
500.0000 mg | INTRAVENOUS | Status: DC
  Administered 2018-07-20 – 2018-07-21 (×2): 500 mg via INTRAVENOUS
  Filled 2018-07-20 (×2): qty 500

## 2018-07-20 MED ORDER — SODIUM CHLORIDE 0.9 % IV SOLN
1.0000 g | INTRAVENOUS | Status: DC
  Filled 2018-07-20: qty 10

## 2018-07-20 MED ORDER — SODIUM CHLORIDE 0.9% FLUSH
3.0000 mL | Freq: Two times a day (BID) | INTRAVENOUS | Status: DC
  Administered 2018-07-20 – 2018-07-22 (×5): 3 mL via INTRAVENOUS

## 2018-07-20 MED ORDER — ONDANSETRON HCL 4 MG PO TABS: 4.0000 mg | ORAL_TABLET | Freq: Four times a day (QID) | ORAL | Status: DC | PRN

## 2018-07-20 MED ORDER — SODIUM CHLORIDE 0.9 % IV SOLN
2.0000 g | INTRAVENOUS | Status: DC
  Administered 2018-07-20: 2 g via INTRAVENOUS
  Filled 2018-07-20: qty 20

## 2018-07-20 MED ORDER — OSELTAMIVIR PHOSPHATE 75 MG PO CAPS
75.0000 mg | ORAL_CAPSULE | Freq: Two times a day (BID) | ORAL | Status: DC
  Administered 2018-07-20 – 2018-07-22 (×4): 75 mg via ORAL
  Filled 2018-07-20 (×6): qty 1

## 2018-07-20 MED ORDER — CLOPIDOGREL BISULFATE 75 MG PO TABS
75.0000 mg | ORAL_TABLET | Freq: Every day | ORAL | Status: DC
  Administered 2018-07-21 – 2018-07-22 (×2): 75 mg via ORAL
  Filled 2018-07-20 (×2): qty 1

## 2018-07-20 MED ORDER — DILTIAZEM HCL ER COATED BEADS 240 MG PO CP24
240.0000 mg | ORAL_CAPSULE | Freq: Every day | ORAL | Status: DC
  Administered 2018-07-20 – 2018-07-22 (×3): 240 mg via ORAL
  Filled 2018-07-20 (×3): qty 1

## 2018-07-20 MED ORDER — LISINOPRIL 20 MG PO TABS
20.0000 mg | ORAL_TABLET | Freq: Every day | ORAL | Status: DC
  Administered 2018-07-20: 20 mg via ORAL
  Filled 2018-07-20 (×2): qty 1

## 2018-07-20 MED ORDER — SENNOSIDES-DOCUSATE SODIUM 8.6-50 MG PO TABS: 1.0000 | ORAL_TABLET | Freq: Every evening | ORAL | Status: DC | PRN

## 2018-07-20 MED ORDER — SODIUM CHLORIDE 0.9% FLUSH: 3.0000 mL | INTRAVENOUS | Status: DC | PRN

## 2018-07-20 NOTE — ED Triage Notes (Signed)
Pt presents to ED via [facility] with c/o altered mental status. Per EMS pt has a hx of dementia at baseline, however for the last few days pt has been more disoriented than normal, per EMS pt is not following commands and is blowing his nose into his socks. Pt is noted to be confused but pleasant on arrival to ED, pt able to state name only at this time.

## 2018-07-20 NOTE — Progress Notes (Signed)
Advanced care plan.  Purpose of the Encounter: CODE STATUS  Parties in Attendance: Patient and family  Patient's Decision Capacity: Okay  Subjective/Patient's story: Presented to the emergency room for body aches, runny nose and lethargy   Objective/Medical story Patient has flu and also pneumonia Needs IV antibiotics, IV fluids and Tamiflu   Goals of care determination:  Advance care directives goals of care treatment plan discussed Patient and family does not want any cardiac resuscitation, intubation ventilator the need arises   CODE STATUS: DNR   Time spent discussing advanced care planning: 16 minutes

## 2018-07-20 NOTE — Plan of Care (Signed)

## 2018-07-20 NOTE — ED Provider Notes (Signed)
[facility] Emergency Department Provider Note    First MD Initiated Contact with Patient 07/20/18 [phone]     (approximate)  I have reviewed the triage vital signs and the nursing notes.   HISTORY  Chief Complaint Altered Mental Status    HPI VEGA WITHROW is a 84 y.o. male history of dementia presents the ER for evaluation of confusion altered mental status over the past 2 days.  Was reportedly exceedingly weak yesterday and unable to ambulate without significant assistance which is new for him.  Has also had congestion and cough.  Home caretaker was recently diagnosed with flu.  He is not been on any antibiotics recently.    Past Medical History:  Diagnosis Date  . Cancer ([place])   . Dementia ([place])   . Hypertension    No family history on file. History reviewed. No pertinent surgical history. Patient Active Problem List   Diagnosis Date Noted  . CAP (community acquired pneumonia) 11/08/2017  . Hypertension 03/05/2017  . Carotid stenosis 03/05/2017  . PAD (peripheral artery disease) ([place]) 03/05/2017      Prior to Admission medications   Medication Sig Start Date End Date Taking? Authorizing Provider  amLODipine (NORVASC) 5 MG tablet Take 1 tablet (5 mg total) by mouth daily. 11/27/17 11/27/18  Rudene Re, MD  aspirin EC 81 MG tablet Take 81 mg by mouth daily.     [provider]  cetirizine (ZYRTEC) 10 MG tablet TAKE 1 TABLET BY MOUTH DAILY 01/18/17   [provider]  citalopram (CELEXA) 20 MG tablet TAKE 1 TABLET BY MOUTH DAILY 08/27/16   [provider]  clopidogrel (PLAVIX) 75 MG tablet Take 75 mg by mouth daily.     [provider]  diltiazem (DILACOR XR) 240 MG 24 hr capsule Take 240 mg by mouth daily.  07/11/10   [provider]  fluticasone (FLONASE) 50 MCG/ACT nasal spray Place 2 sprays into both nostrils daily.    [provider]  lisinopril (PRINIVIL,ZESTRIL) 20 MG tablet Take 20  mg by mouth daily.    [provider]    Allergies Patient has no known allergies.    Social History Social History   Tobacco Use  . Smoking status: Never Smoker  . Smokeless tobacco: Never Used  Substance Use Topics  . Alcohol use: Yes  . Drug use: Never    Review of Systems Patient denies headaches, rhinorrhea, blurry vision, numbness, shortness of breath, chest pain, edema, cough, abdominal pain, nausea, vomiting, diarrhea, dysuria, fevers, rashes or hallucinations unless otherwise stated above in HPI. ____________________________________________   PHYSICAL EXAM:  VITAL SIGNS: Vitals:   07/20/18 1000 07/20/18 1130  BP: (!) 168/63 (!) 153/70  Pulse: 77 76  Resp: (!) 22 19  Temp:    SpO2: 91% 97%    Constitutional: Alert but disoriented x3,  Ill appearing in moderate respiratory distress Eyes: Conjunctivae are normal.  Head: Atraumatic. Nose: No congestion/rhinnorhea. Mouth/Throat: Mucous membranes are moist.   Neck: No stridor. Painless ROM.  Cardiovascular: Normal rate, regular rhythm. Grossly normal heart sounds.  Good peripheral circulation. Respiratory: Normal respiratory effort. Right sided rhonchi with increased wob and hypoxia requiring supplemental o2 Gastrointestinal: Soft and nontender. No distention. No abdominal bruits. No CVA tenderness. Genitourinary:  Musculoskeletal: No lower extremity tenderness nor edema.  No joint effusions. Neurologic:   No gross focal neurologic deficits are appreciated. No facial droop Skin:  Skin is warm, dry and intact. No rash noted. Psychiatric:  Speech and behavior are normal.  ____________________________________________   LABS (all labs ordered are listed, but only abnormal results are displayed)  Results for orders placed or performed during the hospital encounter of 07/20/18 (from the past 24 hour(s))  CBC with Differential     Status: Abnormal   Collection Time: 07/20/18  9:50 AM  Result Value Ref  Range   WBC 5.4 4.0 - 10.5 K/uL   RBC 4.86 4.22 - 5.81 MIL/uL   Hemoglobin 14.4 13.0 - 17.0 g/dL   HCT 43.7 39.0 - 52.0 %   MCV 89.9 80.0 - 100.0 fL   MCH 29.6 26.0 - 34.0 pg   MCHC 33.0 30.0 - 36.0 g/dL   RDW 13.0 11.5 - 15.5 %   Platelets 216 150 - 400 K/uL   nRBC 0.0 0.0 - 0.2 %   Neutrophils Relative % 56 %   Neutro Abs 2.9 1.7 - 7.7 K/uL   Lymphocytes Relative 23 %   Lymphs Abs 1.3 0.7 - 4.0 K/uL   Monocytes Relative 20 %   Monocytes Absolute 1.1 (H) 0.1 - 1.0 K/uL   Eosinophils Relative 1 %   Eosinophils Absolute 0.1 0.0 - 0.5 K/uL   Basophils Relative 0 %   Basophils Absolute 0.0 0.0 - 0.1 K/uL   Immature Granulocytes 0 %   Abs Immature Granulocytes 0.02 0.00 - 0.07 K/uL  Comprehensive metabolic panel     Status: Abnormal   Collection Time: 07/20/18  9:50 AM  Result Value Ref Range   Sodium 134 (L) 135 - 145 mmol/L   Potassium 3.8 3.5 - 5.1 mmol/L   Chloride 100 98 - 111 mmol/L   CO2 26 22 - 32 mmol/L   Glucose, Bld 145 (H) 70 - 99 mg/dL   BUN 21 8 - 23 mg/dL   Creatinine, Ser 1.04 0.61 - 1.24 mg/dL   Calcium 8.7 (L) 8.9 - 10.3 mg/dL   Total Protein 7.1 6.5 - 8.1 g/dL   Albumin 4.0 3.5 - 5.0 g/dL   AST 26 15 - 41 U/L   ALT 33 0 - 44 U/L   Alkaline Phosphatase 54 38 - 126 U/L   Total Bilirubin 0.7 0.3 - 1.2 mg/dL   GFR calc non Af Amer >60 >60 mL/min   GFR calc Af Amer >60 >60 mL/min   Anion gap 8 5 - 15  Urinalysis, Complete w Microscopic     Status: Abnormal   Collection Time: 07/20/18 11:13 AM  Result Value Ref Range   Color, Urine YELLOW (A) YELLOW   APPearance CLEAR (A) CLEAR   Specific Gravity, Urine 1.017 1.005 - 1.030   pH 6.0 5.0 - 8.0   Glucose, UA NEGATIVE NEGATIVE mg/dL   Hgb urine dipstick NEGATIVE NEGATIVE   Bilirubin Urine NEGATIVE NEGATIVE   Ketones, ur 5 (A) NEGATIVE mg/dL   Protein, ur NEGATIVE NEGATIVE mg/dL   Nitrite NEGATIVE NEGATIVE   Leukocytes, UA NEGATIVE NEGATIVE   RBC / HPF 0-5 0 - 5 RBC/hpf   WBC, UA 0-5 0 - 5 WBC/hpf    Bacteria, UA NONE SEEN NONE SEEN   Squamous Epithelial / LPF 0-5 0 - 5   Mucus PRESENT   Influenza panel by PCR (type A & B)     Status: Abnormal   Collection Time: 07/20/18 11:13 AM  Result Value Ref Range   Influenza A By PCR POSITIVE (A) NEGATIVE   Influenza B By PCR NEGATIVE NEGATIVE  Lactic acid, plasma     Status:  None   Collection Time: 07/20/18 11:13 AM  Result Value Ref Range   Lactic Acid, Venous 1.0 0.5 - 1.9 mmol/L   ____________________________________________  EKG My review and personal interpretation at Time: 9:43   Indication: couh  Rate: 75  Rhythm: sinus Axis: normal Other: normal intervals, no stemi, occasional pac ____________________________________________  RADIOLOGY  I personally reviewed all radiographic images ordered to evaluate for the above acute complaints and reviewed radiology reports and findings.  These findings were personally discussed with the patient.  Please see medical record for radiology report.  ____________________________________________   PROCEDURES  Procedure(s) performed:  .Critical Care Performed by: Merlyn Lot, MD Authorized by: Merlyn Lot, MD   Critical care provider statement:    Critical care time (minutes):  30   Critical care time was exclusive of:  Separately billable procedures and treating other patients   Critical care was necessary to treat or prevent imminent or life-threatening deterioration of the following conditions:  Respiratory failure   Critical care was time spent personally by me on the following activities:  Development of treatment plan with patient or surrogate, discussions with consultants, evaluation of patient's response to treatment, examination of patient, obtaining history from patient or surrogate, ordering and performing treatments and interventions, ordering and review of laboratory studies, ordering and review of radiographic studies, pulse oximetry, re-evaluation of patient's  condition and review of old charts      Critical Care performed: yes ____________________________________________   INITIAL IMPRESSION / [place] / ED COURSE  Pertinent labs & imaging results that were available during my care of the patient were reviewed by me and considered in my medical decision making (see chart for details).   DDX: Dehydration, sepsis, pna, uti, hypoglycemia, cva, drug effect, withdrawal, encephalitis  LABIB CWYNAR is a 84 y.o. who presents to the ED with symptoms as described above.  Patient with low-grade temperature and evidence of acute respiratory failure with hypoxia.  Patient did test positive for influenza A.  X-ray does show evidence of probable pneumonia.  Will be treated with IV antibiotics for pneumonia and started on Tamiflu.  Patient require hospitalization for further medical management.      As part of my medical decision making, I reviewed the following data within the [place] notes reviewed and incorporated, Labs reviewed, notes from prior ED visits and [place] Controlled Substance Database   ____________________________________________   FINAL CLINICAL IMPRESSION(S) / ED DIAGNOSES  Final diagnoses:  Acute respiratory failure with hypoxia ([place])  Influenza A      NEW MEDICATIONS STARTED DURING THIS VISIT:  New Prescriptions   No medications on file     Note:  This document was prepared using Dragon voice recognition software and may include unintentional dictation errors.    Merlyn Lot, MD 07/20/18 1229

## 2018-07-20 NOTE — H&P (Signed)
[facility] NAME: Charles Velasquez    MR#:  [id number]  DATE OF BIRTH:  01-08-1928  DATE OF ADMISSION:  07/20/2018  PRIMARY CARE PHYSICIAN: [name], NP   REQUESTING/REFERRING PHYSICIAN:   CHIEF COMPLAINT:   Chief Complaint  Patient presents with  . Altered Mental Status    HISTORY OF PRESENT ILLNESS: Charles Velasquez  is a 84 y.o. male with a known history of dementia, hypertension was brought to the emergency room by patient's family.  He appeared lethargic and disoriented according to the family.  Patient has cough, runny nose and generalized body aches.  Patient usually ambulates independently but for the last 2 days he is able to walk around with the help of a walker.  He was evaluated in the emergency room flu test is positive and he also has pneumonia.  Patient received oral Tamiflu and started on IV Rocephin and Zithromax antibiotics.  Hospitalist service was consulted for further care.  He is awake and responds to verbal commands .  PAST MEDICAL HISTORY:   Past Medical History:  Diagnosis Date  . Cancer ([place])   . Dementia ([place])   . Hypertension     PAST SURGICAL HISTORY: History reviewed. No pertinent surgical history.  SOCIAL HISTORY:  Social History   Tobacco Use  . Smoking status: Never Smoker  . Smokeless tobacco: Never Used  Substance Use Topics  . Alcohol use: Yes    FAMILY HISTORY: No family history on file.  DRUG ALLERGIES: No Known Allergies  REVIEW OF SYSTEMS:   CONSTITUTIONAL: No fever,has  fatigue and weakness.  EYES: No blurred or double vision.  EARS, NOSE, AND THROAT: No tinnitus or ear pain.  RESPIRATORY: Has cough, runny nose no shortness of breath, wheezing or hemoptysis.  CARDIOVASCULAR: No chest pain, orthopnea, edema.  GASTROINTESTINAL: No nausea, vomiting, diarrhea or abdominal pain.  GENITOURINARY: No dysuria, hematuria.  ENDOCRINE: No polyuria, nocturia,  HEMATOLOGY:  No anemia, easy bruising or bleeding SKIN: No rash or lesion. MUSCULOSKELETAL: No joint pain or arthritis.   NEUROLOGIC: No tingling, numbness, weakness.  PSYCHIATRY: No anxiety or depression.   MEDICATIONS AT HOME:  Prior to Admission medications   Medication Sig Start Date End Date Taking? Authorizing Provider  amLODipine (NORVASC) 5 MG tablet Take 1 tablet (5 mg total) by mouth daily. 11/27/17 11/27/18  Rudene Re, MD  aspirin EC 81 MG tablet Take 81 mg by mouth daily.     [provider]  cetirizine (ZYRTEC) 10 MG tablet TAKE 1 TABLET BY MOUTH DAILY 01/18/17   [provider]  citalopram (CELEXA) 20 MG tablet TAKE 1 TABLET BY MOUTH DAILY 08/27/16   [provider]  clopidogrel (PLAVIX) 75 MG tablet Take 75 mg by mouth daily.     [provider]  diltiazem (DILACOR XR) 240 MG 24 hr capsule Take 240 mg by mouth daily.  07/11/10   [provider]  fluticasone (FLONASE) 50 MCG/ACT nasal spray Place 2 sprays into both nostrils daily.    [provider]  lisinopril (PRINIVIL,ZESTRIL) 20 MG tablet Take 20 mg by mouth daily.    [provider]      PHYSICAL EXAMINATION:   VITAL SIGNS: Blood pressure (!) 153/70, pulse 76, temperature 99 F (37.2 C), temperature source Oral, resp. rate 19, height 6\' 2"  (1.88 m), weight 99.8 kg, SpO2 97 %.  GENERAL:  84 y.o.-year-old patient lying in the bed with no acute distress.  EYES: Pupils equal, round, reactive to light and accommodation. No scleral icterus. Extraocular muscles intact.  HEENT: Head atraumatic, normocephalic. Oropharynx and nasopharynx clear.  NECK:  Supple, no jugular venous distention. No thyroid enlargement, no tenderness.  LUNGS: Decreased breath sounds bilaterally, no wheezing, rales in left lung. No use of accessory muscles of respiration.  CARDIOVASCULAR: S1, S2 normal. No murmurs, rubs, or gallops.  ABDOMEN: Soft, nontender, nondistended. Bowel sounds present. No  organomegaly or mass.  EXTREMITIES: No pedal edema, cyanosis, or clubbing.  NEUROLOGIC: Cranial nerves II through XII are intact. Muscle strength 5/5 in all extremities. Sensation intact. Gait not checked.  PSYCHIATRIC: The patient is alert and oriented x 2.  SKIN: No obvious rash, lesion, or ulcer.   LABORATORY PANEL:   CBC Recent Labs  Lab 07/20/18 0950  WBC 5.4  HGB 14.4  HCT 43.7  PLT 216  MCV 89.9  MCH 29.6  MCHC 33.0  RDW 13.0  LYMPHSABS 1.3  MONOABS 1.1*  EOSABS 0.1  BASOSABS 0.0   ------------------------------------------------------------------------------------------------------------------  Chemistries  Recent Labs  Lab 07/20/18 0950  NA 134*  K 3.8  CL 100  CO2 26  GLUCOSE 145*  BUN 21  CREATININE 1.04  CALCIUM 8.7*  AST 26  ALT 33  ALKPHOS 54  BILITOT 0.7   ------------------------------------------------------------------------------------------------------------------ estimated creatinine clearance is 61.9 mL/min (by C-G formula based on SCr of 1.04 mg/dL). ------------------------------------------------------------------------------------------------------------------ No results for input(s): TSH, T4TOTAL, T3FREE, THYROIDAB in the last 72 hours.  Invalid input(s): FREET3   Coagulation profile No results for input(s): INR, PROTIME in the last 168 hours. ------------------------------------------------------------------------------------------------------------------- No results for input(s): DDIMER in the last 72 hours. -------------------------------------------------------------------------------------------------------------------  Cardiac Enzymes No results for input(s): CKMB, TROPONINI, MYOGLOBIN in the last 168 hours.  Invalid input(s): CK ------------------------------------------------------------------------------------------------------------------ Invalid input(s):  POCBNP  ---------------------------------------------------------------------------------------------------------------  Urinalysis    Component Value Date/Time   COLORURINE YELLOW (A) 07/20/2018 1113   APPEARANCEUR CLEAR (A) 07/20/2018 1113   APPEARANCEUR Clear 12/12/2013 0022   LABSPEC 1.017 07/20/2018 1113   LABSPEC 1.015 12/12/2013 0022   PHURINE 6.0 07/20/2018 1113   GLUCOSEU NEGATIVE 07/20/2018 1113   GLUCOSEU Negative 12/12/2013 0022   HGBUR NEGATIVE 07/20/2018 1113   BILIRUBINUR NEGATIVE 07/20/2018 1113   BILIRUBINUR Negative 12/12/2013 0022   KETONESUR 5 (A) 07/20/2018 1113   PROTEINUR NEGATIVE 07/20/2018 1113   NITRITE NEGATIVE 07/20/2018 1113   LEUKOCYTESUR NEGATIVE 07/20/2018 1113   LEUKOCYTESUR Negative 12/12/2013 0022     RADIOLOGY: Dg Chest 2 View  Result Date: 07/20/2018 CLINICAL DATA:  Altered mental status. EXAM: CHEST - 2 VIEW COMPARISON:  Radiographs of November 26, 2017. FINDINGS: Stable cardiomediastinal silhouette. Atherosclerosis of thoracic aorta is noted. No pneumothorax is noted. Minimal right basilar subsegmental atelectasis is noted. Increased left basilar atelectasis or infiltrate is noted with probable minimal pleural effusion. Bony thorax is unremarkable. IMPRESSION: Increased left basilar atelectasis or infiltrate is noted with probable minimal left pleural effusion. Minimal right basilar subsegmental atelectasis. Aortic Atherosclerosis (ICD10-I70.0). Electronically Signed   By: Marijo Conception, M.D.   On: 07/20/2018 10:25    EKG: Orders placed or performed during the hospital encounter of 07/20/18  . ED EKG  . ED EKG  . EKG 12-Lead  . EKG 12-Lead    IMPRESSION AND PLAN:  84 year old elderly male patient with history of dementia ,hypertension presented to the emergency room with weakness, runny nose and body aches  -Community-acquired pneumonia Start patient on IV Rocephin and Zithromax antibiotics Follow-up cultures  -Flu syndrome Start  patient on oral Tamiflu IV fluids PRN Tylenol  -Hypoxia probably secondary to pneumonia Oxygen via nasal cannula at 2 L  -Ambulatory dysfunction secondary to flu syndrome Endurance training exercises by physical therapy  -DVT prophylaxis subcu Lovenox daily  -Dementia Supportive care  All the records are reviewed and case discussed with ED provider. Management plans discussed with the patient, family and they are in agreement.  CODE STATUS:DNR Code Status History    Date Active Date Inactive Code Status Order ID Comments User Context   11/08/2017 2029 11/10/2017 1721 DNR [id number]  Gorden Harms, MD ED   11/08/2017 2029 11/08/2017 2029 Full Code [id number]  Salary, Avel Peace, MD ED    Questions for Most Recent Historical Code Status (Order [id number])    Question Answer Comment   In the event of cardiac or respiratory ARREST Do not call a "code blue"    In the event of cardiac or respiratory ARREST Do not perform Intubation, CPR, defibrillation or ACLS    In the event of cardiac or respiratory ARREST Use medication by any route, position, wound care, and other measures to relive pain and suffering. May use oxygen, suction and manual treatment of airway obstruction as needed for comfort.    Comments nurse may pronounce        TOTAL TIME TAKING CARE OF THIS PATIENT: 52 minutes.    Saundra Shelling M.D on 07/20/2018 at 12:52 PM  Between 7am to 6pm - Pager - [phone]  After 6pm go to [email or web address] - password [email or web address] [facility]  [facility]  Office  [phone]  CC: Primary care physician; [name], NP

## 2018-07-21 LAB — BASIC METABOLIC PANEL
Anion gap: 9 (ref 5–15)
BUN: 20 mg/dL (ref 8–23)
CO2: 25 mmol/L (ref 22–32)
Calcium: 8.4 mg/dL — ABNORMAL LOW (ref 8.9–10.3)
Chloride: 100 mmol/L (ref 98–111)
Creatinine, Ser: 0.83 mg/dL (ref 0.61–1.24)
GFR calc Af Amer: >60 mL/min (ref >60)
Glucose, Bld: 134 mg/dL — ABNORMAL HIGH (ref 70–99)
Potassium: 3.6 mmol/L (ref 3.5–5.1)
Sodium: 134 mmol/L — ABNORMAL LOW (ref 135–145)

## 2018-07-21 LAB — CBC
HCT: 45 % (ref 39.0–52.0)
HEMOGLOBIN: 14.7 g/dL (ref 13.0–17.0)
MCH: 29.3 pg (ref 26.0–34.0)
MCHC: 32.7 g/dL (ref 30.0–36.0)
MCV: 89.6 fL (ref 80.0–100.0)
Platelets: 204 10*3/uL (ref 150–400)
RBC: 5.02 MIL/uL (ref 4.22–5.81)
RDW: 12.8 % (ref 11.5–15.5)
WBC: 4.8 10*3/uL (ref 4.0–10.5)
nRBC: 0 % (ref 0.0–0.2)

## 2018-07-21 LAB — PROCALCITONIN

## 2018-07-21 MED ORDER — HYDRALAZINE HCL 25 MG PO TABS
25.0000 mg | ORAL_TABLET | Freq: Three times a day (TID) | ORAL | Status: DC
  Administered 2018-07-21 – 2018-07-22 (×4): 25 mg via ORAL
  Filled 2018-07-21 (×5): qty 1

## 2018-07-21 MED ORDER — HYDRALAZINE HCL 20 MG/ML IJ SOLN: 10.0000 mg | Freq: Four times a day (QID) | INTRAMUSCULAR | Status: DC | PRN

## 2018-07-21 MED ORDER — LISINOPRIL 20 MG PO TABS
40.0000 mg | ORAL_TABLET | Freq: Every day | ORAL | Status: DC
  Administered 2018-07-21 – 2018-07-22 (×2): 40 mg via ORAL
  Filled 2018-07-21: qty 2

## 2018-07-21 MED ORDER — IPRATROPIUM-ALBUTEROL 0.5-2.5 (3) MG/3ML IN SOLN
3.0000 mL | Freq: Three times a day (TID) | RESPIRATORY_TRACT | Status: DC
  Administered 2018-07-21 – 2018-07-22 (×3): 3 mL via RESPIRATORY_TRACT
  Filled 2018-07-21 (×3): qty 3

## 2018-07-21 MED ORDER — IPRATROPIUM-ALBUTEROL 0.5-2.5 (3) MG/3ML IN SOLN
3.0000 mL | Freq: Three times a day (TID) | RESPIRATORY_TRACT | Status: DC
  Administered 2018-07-21 (×2): 3 mL via RESPIRATORY_TRACT
  Filled 2018-07-21: qty 3
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
## 2018-07-21 NOTE — Evaluation (Signed)
Physical Therapy Evaluation Patient Details Name: Charles Velasquez MRN: [id number] DOB: 12-19-1927 Today's Date: 07/21/2018   History of Present Illness  Pt is an 84 y.o. male presenting to hospital 07/20/18 with AMS x2 days and weakness.  Pt's home caregiver recently diagnosed with flu.  Pt admitted with (+) influenza A, PNA, and hypoxia probably secondary PNA.  PMH includes dementia, CA, htn, CAP, PAD, carotid stenosis.  Clinical Impression  Prior to hospital admission, pt was mostly independent with functional mobility (except supervision d/t cognition); was using walker for 2 days prior to hospital admission d/t weakness/difficulty with ambulation.  Pt lives with his wife (who uses a walker); has some home services.  Currently pt is able to ambulate around nursing loop with no AD CGA (2nd assist present for safety but not required).  Trialed RW and hand hold assist but pt had difficulty with this technique and assistive device use.  Pt would benefit from skilled PT to address noted impairments and functional limitations (see below for any additional details).  Upon hospital discharge, recommend pt discharge to familiar home setting with assist for functional mobility for safety and HHPT.  Pt's O2 sats 90-91% on room air prior to mobility; O2 sats increased to 93-95% post ambulation; O2 sats decreased to 88-90% at rest end of session (pt placed back on 2 L O2 via nasal cannula and O2 sats 93%): nurse and MD Sudini notified of pt's mobility and O2 information.    Follow Up Recommendations Home health PT(SBA for mobility/OOB)    Equipment Recommendations  None recommended by PT    Recommendations for Other Services       Precautions / Restrictions Precautions Precautions: Fall Restrictions Weight Bearing Restrictions: No      Mobility  Bed Mobility Overal bed mobility: Needs Assistance Bed Mobility: Supine to Sit     Supine to sit: Supervision;HOB elevated     General bed mobility  comments: mild increased effort to perform on own  Transfers Overall transfer level: Needs assistance Equipment used: None Transfers: Sit to/from Stand Sit to Stand: Min assist;+2 safety/equipment         General transfer comment: min assist x1 to stand from bed (CGA of 2nd for safety); CGA x1 to stand from recliner x4 trials  Ambulation/Gait Ambulation/Gait assistance: Min guard(2nd assist present for safety but not required) Gait Distance (Feet): (20 feet with RW; 20 feet with B hand hold assist; 20 feet no UE support; 200 feet no UE support) Assistive device: Rolling walker (2 wheeled);2 person hand held assist;None   Gait velocity: decreased   General Gait Details: pt demonstrating more of a shuffling gait pattern (decreased B step length/foot clearance/heelstrike)--pt's son reports this is baseline; last 40 feet pt with decreased B step length compared to initial ambulation  Stairs            Wheelchair Mobility    Modified Rankin (Stroke Patients Only)       Balance Overall balance assessment: Needs assistance Sitting-balance support: No upper extremity supported;Feet supported Sitting balance-Leahy Scale: Good Sitting balance - Comments: steady sitting reaching within BOS   Standing balance support: No upper extremity supported   Standing balance comment: steady standing reaching within BOS                             Pertinent Vitals/Pain Pain Assessment: Faces Faces Pain Scale: No hurt Pain Intervention(s): Limited activity within patient's tolerance;Monitored during session  HR WFL during session    Home Living Family/patient expects to be discharged to:: Private residence Living Arrangements: Spouse/significant other Available Help at Discharge: Family;Available PRN/intermittently Type of Home: House Home Access: Stairs to enter Entrance Stairs-Rails: Left Entrance Stairs-Number of Steps: 2 plus small step to enter home Home Layout:  One level Home Equipment: Walker - 2 wheels      Prior Function Level of Independence: Needs assistance   Gait / Transfers Assistance Needed: Independent with functional mobility overall but supervision d/t cognitive impairments.  ADL's / Homemaking Assistance Needed: Caregiver comes in morning to assist pt with morning tasks and assists pt later in evening (helps pt's wife during day); 4 hours caregiver assist each day on weekends  Comments: Goes to [facility] during day (gets dropped off and picked up by caregiver).     Hand Dominance        Extremity/Trunk Assessment   Upper Extremity Assessment Upper Extremity Assessment: Generalized weakness    Lower Extremity Assessment Lower Extremity Assessment: Generalized weakness    Cervical / Trunk Assessment Cervical / Trunk Assessment: Normal  Communication   Communication: No difficulties  Cognition Arousal/Alertness: Awake/alert Behavior During Therapy: Flat affect Overall Cognitive Status: History of cognitive impairments - at baseline(Oriented to person only)                                        General Comments   Nursing cleared pt for participation in physical therapy.  Pt agreeable to PT session.  Pt's son present end of session.    Exercises  Gait training   Assessment/Plan    PT Assessment Patient needs continued PT services  PT Problem List Decreased strength;Decreased balance;Decreased mobility;Decreased knowledge of use of DME;Cardiopulmonary status limiting activity       PT Treatment Interventions DME instruction;Gait training;Stair training;Functional mobility training;Therapeutic activities;Therapeutic exercise;Neuromuscular re-education;Balance training    PT Goals (Current goals can be found in the Care Plan section)  Acute Rehab PT Goals Patient Stated Goal: to improve walking PT Goal Formulation: With patient/family Time For Goal Achievement: 08/04/18 Potential  to Achieve Goals: Good    Frequency Min 2X/week   Barriers to discharge        Co-evaluation               AM-PAC PT "6 Clicks" Mobility  Outcome Measure Help needed turning from your back to your side while in a flat bed without using bedrails?: A Little Help needed moving from lying on your back to sitting on the side of a flat bed without using bedrails?: A Little Help needed moving to and from a bed to a chair (including a wheelchair)?: A Little Help needed standing up from a chair using your arms (e.g., wheelchair or bedside chair)?: A Little Help needed to walk in hospital room?: A Little Help needed climbing 3-5 steps with a railing? : A Little 6 Click Score: 18    End of Session Equipment Utilized During Treatment: Gait belt Activity Tolerance: Patient tolerated treatment well Patient left: in chair;with call bell/phone within reach;with chair alarm set;with family/visitor present Nurse Communication: Mobility status;Precautions PT Visit Diagnosis: Other abnormalities of gait and mobility (R26.89);Muscle weakness (generalized) (M62.81);Difficulty in walking, not elsewhere classified (R26.2)    Time: 6546-5035 PT Time Calculation (min) (ACUTE ONLY): 50 min   Charges:   PT Evaluation $PT Eval Low Complexity: 1 Low  PT Treatments $Gait Training: 23-37 mins       Leitha Bleak, PT 07/21/18, 4:54 PM [phone]
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
## 2018-07-21 NOTE — Progress Notes (Signed)
[place] [facility] [place] NAME: Charles Velasquez    MR#:  [id number]  DATE OF BIRTH:  [birth year]  SUBJECTIVE:  CHIEF COMPLAINT:   Chief Complaint  Patient presents with  . Altered Mental Status   Son at bedside  Patient confused  Afebrile On 2 L O2 Confused  REVIEW OF SYSTEMS:    Review of Systems  Unable to perform ROS: Dementia    DRUG ALLERGIES:  No Known Allergies  VITALS:  Blood pressure 133/67, pulse (!) 58, temperature 97.8 F (36.6 C), temperature source Oral, resp. rate 20, height 6\' 2"  (1.88 m), weight 99.8 kg, SpO2 95 %.  PHYSICAL EXAMINATION:   Physical Exam  GENERAL:  84 y.o.-year-old patient lying in the bed with no acute distress.  EYES: Pupils equal, round, reactive to light and accommodation. No scleral icterus. Extraocular muscles intact.  HEENT: Head atraumatic, normocephalic. Oropharynx and nasopharynx clear.  NECK:  Supple, no jugular venous distention. No thyroid enlargement, no tenderness.  LUNGS: Normal breath sounds bilaterally, no wheezing, rales, rhonchi. No use of accessory muscles of respiration.  CARDIOVASCULAR: S1, S2 normal. No murmurs, rubs, or gallops.  ABDOMEN: Soft, nontender, nondistended. Bowel sounds present. No organomegaly or mass.  EXTREMITIES: No cyanosis, clubbing or edema b/l.    NEUROLOGIC: Cranial nerves II through XII are intact. No focal Motor or sensory deficits b/l.   PSYCHIATRIC: The patient is alert and awake SKIN: No obvious rash, lesion, or ulcer.   LABORATORY PANEL:   CBC Recent Labs  Lab 07/21/18 0525  WBC 4.8  HGB 14.7  HCT 45.0  PLT 204   ------------------------------------------------------------------------------------------------------------------ Chemistries  Recent Labs  Lab 07/20/18 0950 07/21/18 0525  NA 134* 134*  K 3.8 3.6  CL 100 100  CO2 26 25  GLUCOSE 145* 134*  BUN 21 20  CREATININE 1.04 0.83  CALCIUM 8.7* 8.4*  AST 26  --   ALT 33  --    ALKPHOS 54  --   BILITOT 0.7  --    ------------------------------------------------------------------------------------------------------------------  Cardiac Enzymes No results for input(s): TROPONINI in the last 168 hours. ------------------------------------------------------------------------------------------------------------------  RADIOLOGY:  Dg Chest 2 View  Result Date: 07/20/2018 CLINICAL DATA:  Altered mental status. EXAM: CHEST - 2 VIEW COMPARISON:  Radiographs of November 26, 2017. FINDINGS: Stable cardiomediastinal silhouette. Atherosclerosis of thoracic aorta is noted. No pneumothorax is noted. Minimal right basilar subsegmental atelectasis is noted. Increased left basilar atelectasis or infiltrate is noted with probable minimal pleural effusion. Bony thorax is unremarkable. IMPRESSION: Increased left basilar atelectasis or infiltrate is noted with probable minimal left pleural effusion. Minimal right basilar subsegmental atelectasis. Aortic Atherosclerosis (ICD10-I70.0). Electronically Signed   By: Marijo Conception, M.D.   On: 07/20/2018 10:25     ASSESSMENT AND PLAN:   84 year old elderly male patient with history of dementia ,hypertension presented to the emergency room with weakness, runny nose and body aches  * Influenza A  oral Tamiflu PRN Tylenol Nebs PRN  * Acute hypoxic resp failure improving  -Ambulatory dysfunction secondary to flu   physical therapy  -DVT prophylaxis subcu Lovenox daily  -Dementia Monitor for inpatient delirium  All the records are reviewed and case discussed with Care Management/Social Worker Management plans discussed with the patient, family and they are in agreement.  CODE STATUS: DNR  DVT Prophylaxis: SCDs  TOTAL TIME TAKING CARE OF THIS PATIENT: 35 minutes.   POSSIBLE D/C IN 1-2 DAYS, DEPENDING ON CLINICAL CONDITION.  [name] [name]  M.D on 07/21/2018 at 2:02 PM  Between 7am to 6pm - Pager - [phone]  After  6pm go to [email or web address] - password [email or web address] [facility]  Office  [phone]  CC: Primary care physician; [name], NP  Note: This dictation was prepared with Dragon dictation along with smaller phrase technology. Any transcriptional errors that result from this process are unintentional.

## 2018-07-22 MED ORDER — LORAZEPAM 2 MG/ML IJ SOLN
INTRAMUSCULAR | Status: AC
  Filled 2018-07-22: qty 1

## 2018-07-22 MED ORDER — LORAZEPAM 0.5 MG PO TABS: 0.5000 mg | ORAL_TABLET | Freq: Every evening | ORAL | 0 refills | Status: AC | PRN

## 2018-07-22 MED ORDER — LORAZEPAM 2 MG/ML IJ SOLN
1.0000 mg | Freq: Once | INTRAMUSCULAR | Status: AC
  Administered 2018-07-22: 04:00:00 1 mg via INTRAVENOUS

## 2018-07-22 MED ORDER — AMLODIPINE BESYLATE 5 MG PO TABS: 5.0000 mg | ORAL_TABLET | Freq: Every day | ORAL | Status: DC

## 2018-07-22 MED ORDER — OSELTAMIVIR PHOSPHATE 75 MG PO CAPS: 75.0000 mg | ORAL_CAPSULE | Freq: Two times a day (BID) | ORAL | 0 refills | Status: DC

## 2018-07-22 NOTE — Care Management (Addendum)
Mr Caiazzo doesn't qualify for a wheelchair per Medicare guidelines. Spoke with Floydene Flock, [employer] representative. Family can rent a wheelchair for $41.63 week, Family updated. [place] [facility] services. Shelbie Ammons RN MSN CCM Care Management [phone]

## 2018-07-22 NOTE — Care Management (Signed)
Charles Velasquez has the diagnosis of pneumonia, and respiratory issues which impairs their ability to perform daily activities such as dressing, feeding, dressing, and grooming in the home. A walker or cane will not resolve issue with performing activities of daily living. A wheelchair will allow Charles Velasquez to safely perform his daily activities. He can safely propel the wheelchair in the home or has a caregiver who can provide assistance. Shelbie Ammons RN MSN CCM Care Management [phone]

## 2018-07-22 NOTE — Discharge Instructions (Signed)
Resume diet and activity as before ° ° °

## 2018-07-22 NOTE — Progress Notes (Signed)
Physical Therapy Treatment Patient Details Name: Charles Velasquez MRN: [id number] DOB: 07/01/1928 Today's Date: 07/22/2018    History of Present Illness Pt is an 84 y.o. male presenting to hospital 07/20/18 with AMS x2 days and weakness.  Pt's home caregiver recently diagnosed with flu.  Pt admitted with (+) influenza A, PNA, and hypoxia probably secondary PNA.  PMH includes dementia, CA, htn, CAP, PAD, carotid stenosis.    PT Comments    Pt modified independent semi-supine to sit; SBA with transfers; and CGA to SBA with ambulation around nursing loop (no AD).  Pt with an occasional altered stepping pattern but pt able to self correct and maintain balance without any assist.  Pt demonstrating decreased cadence with ambulation today but overall appearing stronger;  slower gait speed appeared to be d/t pt looking around constantly at various things/people in environment (no loss of balance with head turns noted).  O2 sats 93% or greater on room air during session.  Pt appears appropriate for home discharge with supervision for mobility and HHPT (discussed with MD Sudini).    Follow Up Recommendations  Home health PT;Supervision for mobility/OOB     Equipment Recommendations  None recommended by PT    Recommendations for Other Services       Precautions / Restrictions Precautions Precautions: Fall Restrictions Weight Bearing Restrictions: No    Mobility  Bed Mobility Overal bed mobility: Modified Independent       Supine to sit: Modified independent (Device/Increase time)     General bed mobility comments: Semi-supine to sit with HOB elevated with no noted difficulties  Transfers Overall transfer level: Needs assistance Equipment used: None Transfers: Sit to/from [facility] Sit to Stand: Supervision Stand pivot transfers: Supervision       General transfer comment: fairly strong stand (from bed and from recliner) and controlled descent sitting in  recliner; steady  Ambulation/Gait Ambulation/Gait assistance: Min guard;Supervision Gait Distance (Feet): 200 Feet Assistive device: None   Gait velocity: decreased   General Gait Details: pt demonstrating more of a shuffling gait pattern (decreased B step length/foot clearance/heelstrike)--pt's son reported this is baseline   Marine scientist Rankin (Stroke Patients Only)       Balance Overall balance assessment: Needs assistance Sitting-balance support: No upper extremity supported;Feet supported Sitting balance-Leahy Scale: Normal Sitting balance - Comments: steady sitting reaching outside BOS   Standing balance support: No upper extremity supported Standing balance-Leahy Scale: Good Standing balance comment: steady standing reaching within BOS                            Cognition Arousal/Alertness: Awake/alert Behavior During Therapy: Flat affect Overall Cognitive Status: (Oriented to person only)                                        Exercises      General Comments  Pt agreeable to PT session.      Pertinent Vitals/Pain Pain Assessment: Faces Faces Pain Scale: No hurt Pain Intervention(s): Limited activity within patient's tolerance;Monitored during session  HR WFL during session.    Home Living                      Prior Function  PT Goals (current goals can now be found in the care plan section) Acute Rehab PT Goals Patient Stated Goal: to improve walking PT Goal Formulation: With patient Time For Goal Achievement: 08/04/18 Potential to Achieve Goals: Good Progress towards PT goals: Progressing toward goals    Frequency    Min 2X/week      PT Plan Current plan remains appropriate    Co-evaluation              AM-PAC PT "6 Clicks" Mobility   Outcome Measure  Help needed turning from your back to your side while in a flat bed without using  bedrails?: None Help needed moving from lying on your back to sitting on the side of a flat bed without using bedrails?: None Help needed moving to and from a bed to a chair (including a wheelchair)?: A Little Help needed standing up from a chair using your arms (e.g., wheelchair or bedside chair)?: A Little Help needed to walk in hospital room?: A Little Help needed climbing 3-5 steps with a railing? : A Little 6 Click Score: 20    End of Session Equipment Utilized During Treatment: Gait belt Activity Tolerance: Patient tolerated treatment well Patient left: in chair;with call bell/phone within reach;with chair alarm set;with family/visitor present Nurse Communication: Mobility status;Precautions PT Visit Diagnosis: Other abnormalities of gait and mobility (R26.89);Muscle weakness (generalized) (M62.81);Difficulty in walking, not elsewhere classified (R26.2)     Time: 4492-0100 PT Time Calculation (min) (ACUTE ONLY): 23 min  Charges:  $Therapeutic Exercise: 8-22 mins $Therapeutic Activity: 8-22 mins                    Leitha Bleak, PT 07/22/18, 11:45 AM [phone]

## 2018-07-25 LAB — CULTURE, BLOOD (ROUTINE X 2)
Culture: NO GROWTH
Culture: NO GROWTH
SPECIAL REQUESTS: ADEQUATE
SPECIAL REQUESTS: ADEQUATE

## 2018-08-03 NOTE — Discharge Summary (Signed)
[place] [facility] [place] NAME: Charles Velasquez    MR#:  [id number]  DATE OF BIRTH:  Dec 29, 1927  DATE OF ADMISSION:  07/20/2018 ADMITTING PHYSICIAN: Saundra Shelling, MD  DATE OF DISCHARGE: 07/22/2018  5:22 PM  PRIMARY CARE PHYSICIAN: [name], NP   ADMISSION DIAGNOSIS:  Influenza A [J10.1] Acute respiratory failure with hypoxia ([place]) [J96.01]  DISCHARGE DIAGNOSIS:  Active Problems:   CAP (community acquired pneumonia)   SECONDARY DIAGNOSIS:   Past Medical History:  Diagnosis Date  . Cancer ([place])   . Dementia ([place])   . Hypertension      ADMITTING HISTORY  HISTORY OF PRESENT ILLNESS: Charles Velasquez  is a 84 y.o. male with a known history of dementia, hypertension was brought to the emergency room by patient's family.  He appeared lethargic and disoriented according to the family.  Patient has cough, runny nose and generalized body aches.  Patient usually ambulates independently but for the last 2 days he is able to walk around with the help of a walker.  He was evaluated in the emergency room flu test is positive and he also has pneumonia.  Patient received oral Tamiflu and started on IV Rocephin and Zithromax antibiotics.  Hospitalist service was consulted for further care.  He is awake and responds to verbal commands .  HOSPITAL COURSE:   84 year old elderly male patient with history of dementia,hypertension presented to the emergency room with weakness, runny nose and body aches  * Influenza A  oral Tamiflu started. PRN Tylenol Nebs PRN  * Acute hypoxic resp failure  Patient was on 2 L oxygen on admission.  This has resolved.  His saturations are greater than 92% on room air.  -Ambulatory dysfunction secondary to flu   physical therapy saw the patient  Improving.  Discharged home with home health services.  CONSULTS OBTAINED:    DRUG ALLERGIES:  No Known Allergies  DISCHARGE MEDICATIONS:   Allergies as of  07/22/2018   No Known Allergies     Medication List    TAKE these medications   amLODipine 5 MG tablet Commonly known as:  NORVASC Take 1 tablet (5 mg total) by mouth daily.   aspirin EC 81 MG tablet Take 81 mg by mouth daily.   cetirizine 10 MG tablet Commonly known as:  ZYRTEC TAKE 1 TABLET BY MOUTH DAILY   citalopram 20 MG tablet Commonly known as:  CELEXA TAKE 1 TABLET BY MOUTH DAILY   clopidogrel 75 MG tablet Commonly known as:  PLAVIX Take 75 mg by mouth daily.   diltiazem 240 MG 24 hr capsule Commonly known as:  DILACOR XR Take 240 mg by mouth daily.   divalproex 250 MG DR tablet Commonly known as:  DEPAKOTE TAKE ONE TABLET EVERY MORNING   fluticasone 50 MCG/ACT nasal spray Commonly known as:  FLONASE Place 2 sprays into both nostrils daily.   lisinopril 30 MG tablet Commonly known as:  PRINIVIL,ZESTRIL Take 30 mg by mouth daily.   LORazepam 0.5 MG tablet Commonly known as:  ATIVAN Take 1 tablet (0.5 mg total) by mouth at bedtime as needed for anxiety.   oseltamivir 75 MG capsule Commonly known as:  TAMIFLU Take 1 capsule (75 mg total) by mouth 2 (two) times daily.       Today   VITAL SIGNS:  Blood pressure (!) 154/65, pulse 65, temperature (!) 97.5 F (36.4 C), temperature source Oral, resp. rate 18, height 6\' 2"  (1.88 m), weight 99.8  kg, SpO2 92 %.  I/O:  No intake or output data in the 24 hours ending 08/03/18 1256  PHYSICAL EXAMINATION:  Physical Exam  GENERAL:  84 y.o.-year-old patient lying in the bed with no acute distress.  LUNGS: Normal breath sounds bilaterally, no wheezing, rales,rhonchi or crepitation. No use of accessory muscles of respiration.  CARDIOVASCULAR: S1, S2 normal. No murmurs, rubs, or gallops.  ABDOMEN: Soft, non-tender, non-distended. Bowel sounds present. No organomegaly or mass.  NEUROLOGIC: Moves all 4 extremities. PSYCHIATRIC: The patient is alert and awake.  Pleasantly confused SKIN: No obvious rash, lesion,  or ulcer.   DATA REVIEW:   CBC No results for input(s): WBC, HGB, HCT, PLT in the last 168 hours.  Chemistries  No results for input(s): NA, K, CL, CO2, GLUCOSE, BUN, CREATININE, CALCIUM, MG, AST, ALT, ALKPHOS, BILITOT in the last 168 hours.  Invalid input(s): GFRCGP  Cardiac Enzymes No results for input(s): TROPONINI in the last 168 hours.  Microbiology Results  Results for orders placed or performed during the hospital encounter of 07/20/18  Blood Culture (routine x 2)     Status: None   Collection Time: 07/20/18 11:13 AM  Result Value Ref Range Status   Specimen Description BLOOD L  AC  Final   Special Requests   Final    BOTTLES DRAWN AEROBIC AND ANAEROBIC Blood Culture adequate volume   Culture   Final    NO GROWTH 5 DAYS Performed at [facility], [address], [place], [place] [address]    Report Status 07/25/2018 FINAL  Final  Blood Culture (routine x 2)     Status: None   Collection Time: 07/20/18 11:13 AM  Result Value Ref Range Status   Specimen Description BLOOD L HAND  Final   Special Requests   Final    BOTTLES DRAWN AEROBIC AND ANAEROBIC Blood Culture adequate volume   Culture   Final    NO GROWTH 5 DAYS Performed at [facility], [address], [place], [place] [address]    Report Status 07/25/2018 FINAL  Final    RADIOLOGY:  No results found.  Follow up with PCP in 1 week.  Management plans discussed with the patient, family and they are in agreement.  CODE STATUS:  Code Status History    Date Active Date Inactive Code Status Order ID Comments User Context   07/20/2018 1415 07/22/2018 2027 DNR [id number]  Saundra Shelling, MD Inpatient   11/08/2017 2029 11/10/2017 1721 DNR [id number]  Gorden Harms, MD ED   11/08/2017 2029 11/08/2017 2029 Full Code [id number]  Salary, Avel Peace, MD ED    Questions for Most Recent Historical Code Status (Order [id number])    Question Answer Comment   In the event of cardiac or respiratory ARREST Do  not call a "code blue"    In the event of cardiac or respiratory ARREST Do not perform Intubation, CPR, defibrillation or ACLS    In the event of cardiac or respiratory ARREST Use medication by any route, position, wound care, and other measures to relive pain and suffering. May use oxygen, suction and manual treatment of airway obstruction as needed for comfort.    Comments nurse may pronounce         Advance Directive Documentation     Most Recent Value  Type of Advance Directive  Out of facility DNR (pink MOST or yellow form), Healthcare Power of Attorney, Living will  Pre-existing out of facility DNR order (yellow form or pink  MOST form)  Physician notified to receive inpatient order  "MOST" Form in Place?  -      TOTAL TIME TAKING CARE OF THIS PATIENT ON DAY OF DISCHARGE: more than 30 minutes.   Leia Alf Rosamaria Donn M.D on 08/03/2018 at 12:56 PM  Between 7am to 6pm - Pager - [phone]  After 6pm go to [email or web address] - password [email or web address] [facility]  Office  [phone]  CC: Primary care physician; [name], NP  Note: This dictation was prepared with Dragon dictation along with smaller phrase technology. Any transcriptional errors that result from this process are unintentional.

## 2019-02-16 ENCOUNTER — Telehealth: Payer: Self-pay | Admitting: Nurse Practitioner

## 2019-02-16 NOTE — Telephone Encounter (Signed)
Called daughter Luanna Cole to schedule Palliative Consult, no answer.  Left message with reason for call along with my name and contact information

## 2019-02-16 NOTE — Telephone Encounter (Signed)
Called patient to schedule Consult, spoke with patient's caregiver Thayer Headings and she requested that I call the daughter Charles Velasquez, who lives in [place] to schedule.  Will follow-up with her.

## 2019-02-19 ENCOUNTER — Telehealth: Payer: Self-pay | Admitting: Nurse Practitioner

## 2019-02-19 NOTE — Telephone Encounter (Signed)
Returned daughter's call from yesterday, regarding scheduling the Consult.  We have scheduled a Telehealth zoom consult for 02/26/19 @ 12 noon.

## 2019-02-26 ENCOUNTER — Encounter: Payer: Self-pay | Admitting: Nurse Practitioner

## 2019-02-26 ENCOUNTER — Other Ambulatory Visit: Payer: Self-pay

## 2019-02-26 ENCOUNTER — Other Ambulatory Visit: Payer: Medicare Other | Admitting: Nurse Practitioner

## 2019-02-26 DIAGNOSIS — R2681 Unsteadiness on feet: Secondary | ICD-10-CM

## 2019-02-26 DIAGNOSIS — Z515 Encounter for palliative care: Secondary | ICD-10-CM

## 2019-02-26 NOTE — Progress Notes (Signed)
[facility] Consult Note Telephone: [phone]  Fax: [phone]  PATIENT NAME: Charles Velasquez DOB: 11-03-1927 MRN: [id number]  PRIMARY CARE PROVIDER:   [name], NP  REFERRING PROVIDER:  [name], NP [address] [place],   [address]  RESPONSIBLE PARTY:   Izear Pine son  Due to the COVID-19 crisis, this visit was done via telemedicine from my office and it was initiated and consent by this patient and or family.  I was asked by [name] to see [name] for Palliative care consult for [name].  RECOMMENDATIONS and PLAN:  1.Palliative care encounter Z51.5; Palliative medicine team will continue to support patient, patient's family, and medical team. Visit consisted of counseling and education dealing with the complex and emotionally intense issues of symptom management and palliative care in the setting of serious and potentially life-threatening illness  2. Unsteady gait R26.81 secondary to dementia progression of. Continue fall precautions, high-risk. Encourage therapy for balance training. Recommend home PT/OT  ASSESSMENT:     I  completed zoom Telehealth medicine visit with [name], his son, [name]. We talked about the last time [name] was independent at home. We talked about initial diagnosis of dementia. We talked about past medical history in setting of chronic disease. We talked about as functional level as prior to covet he was ambulatory, at times a little unsteady. He was going to [employer] five to six days a week for socialization, interaction and activities. Since covid-19 he has been at home with 24 hour caregivers, more isolated. His gate has become more unsteady with several recent Falls. He does require some assistance with ADLs. He is incontinent. At times he is able to verbalize when he needs you to the bathroom but typically that's already happened. He is able to feed  himself. His appetite is very good. We talked about chronic disease progression of dementia with expectations. We talked about possible option for PT/OT for strengthening, gait training. Family in agreement for PT / OT evaluation. Discuss that will get in touch with primary provider for order for therapy in home. We talked about medical goals of care including aggressive versus conservative versus Comfort Care. We reviewed most form and will mail blank copy of most form in addition to her choice book for their review and complete at next palliative care visit. We talked about DNR form which he does wish to be a DNR. They do not have [place] form at home. Asked if it was okay to complete a new form and will mail copies, scanning Epic [place] [employer]. Family in agreement. We talked about role of palliative care and plan of care. Schedule a follow-up palliative care visit. Therapeutic listening and emotional support provided. Contact information provided. Questions answered to satisfaction.   I spent 60 minutes providing this consultation,  from 11:45am to 12:45pm . More than 50% of the time in this consultation was spent coordinating communication.   HISTORY OF PRESENT ILLNESS:  Charles Velasquez is a 85 y.o. year old male with multiple medical problems includingDementia, cancer, hypertension, peripheral vascular disease, carotid artery stenosis. Hospitalized 12 /15 / 2019 to 12 / 17 / 2019 for acute respiratory failure in the setting of influenza A, community-acquired pneumonia. Part of the testicles ation he was walking independently. He did receive Tamiflu and IV antibiotics. He was discharged home with Accident. Mr. Cansler resides at home with 24 hour caregivers. He has two  daughters Almyra Free and Shauna Hugh and a son named [name]. His two daughters to reside in [place] and his son [name] is local. Palliative Care was asked to help address goals of care.   CODE STATUS: DNR  PPS: 40% HOSPICE  ELIGIBILITY/DIAGNOSIS: TBD  PAST MEDICAL HISTORY:  Past Medical History:  Diagnosis Date   Cancer ([place])    Dementia ([place])    Hypertension     SOCIAL HX:  Social History   Tobacco Use   Smoking status: Never Smoker   Smokeless tobacco: Never Used  Substance Use Topics   Alcohol use: Yes    ALLERGIES: No Known Allergies   PERTINENT MEDICATIONS:  Outpatient Encounter Medications as of 02/26/2019  Medication Sig   amLODipine (NORVASC) 5 MG tablet Take 1 tablet (5 mg total) by mouth daily.   aspirin EC 81 MG tablet Take 81 mg by mouth daily.    cetirizine (ZYRTEC) 10 MG tablet TAKE 1 TABLET BY MOUTH DAILY   citalopram (CELEXA) 20 MG tablet TAKE 1 TABLET BY MOUTH DAILY   clopidogrel (PLAVIX) 75 MG tablet Take 75 mg by mouth daily.    diltiazem (DILACOR XR) 240 MG 24 hr capsule Take 240 mg by mouth daily.    divalproex (DEPAKOTE) 250 MG DR tablet TAKE ONE TABLET EVERY MORNING   fluticasone (FLONASE) 50 MCG/ACT nasal spray Place 2 sprays into both nostrils daily.   lisinopril (PRINIVIL,ZESTRIL) 30 MG tablet Take 30 mg by mouth daily.    LORazepam (ATIVAN) 0.5 MG tablet Take 1 tablet (0.5 mg total) by mouth at bedtime as needed for anxiety.   oseltamivir (TAMIFLU) 75 MG capsule Take 1 capsule (75 mg total) by mouth 2 (two) times daily.   No facility-administered encounter medications on file as of 02/26/2019.     PHYSICAL EXAM:   Deferred  Justa Hatchell Z Dalila Arca, NP

## 2019-02-27 ENCOUNTER — Telehealth: Payer: Self-pay

## 2019-02-27 NOTE — Telephone Encounter (Signed)
At the request of Palliative NP,  PCP office contacted to request an order for PT/OT eval due to unsteady gait, increased weakness and falls.

## 2019-03-02 ENCOUNTER — Telehealth: Payer: Self-pay

## 2019-03-02 NOTE — Telephone Encounter (Signed)
Received message from [facility] with PCP office requesting a return call. Phone call returned to office and received message that office was now closed. Will follow up in am.

## 2019-03-02 NOTE — Telephone Encounter (Signed)
Phone call placed to PCP office. Spoke with [place] to provide update on NP visit to include request for PT/OT eval due to weakness, falls and unsteady gait.

## 2019-03-04 ENCOUNTER — Other Ambulatory Visit: Payer: Self-pay

## 2019-03-04 ENCOUNTER — Emergency Department: Payer: Medicare Other

## 2019-03-04 ENCOUNTER — Telehealth: Payer: Self-pay

## 2019-03-04 ENCOUNTER — Emergency Department
Admission: EM | Admit: 2019-03-04 | Discharge: 2019-03-04 | Disposition: A | Discharge status: Home / Self Care | Payer: Medicare Other | Attending: Emergency Medicine | Admitting: Emergency Medicine

## 2019-03-04 DIAGNOSIS — Y9389 Activity, other specified: Secondary | ICD-10-CM | POA: Insufficient documentation

## 2019-03-04 DIAGNOSIS — S0101XA Laceration without foreign body of scalp, initial encounter: Secondary | ICD-10-CM | POA: Diagnosis not present

## 2019-03-04 DIAGNOSIS — Y998 Other external cause status: Secondary | ICD-10-CM | POA: Diagnosis not present

## 2019-03-04 DIAGNOSIS — F0390 Unspecified dementia without behavioral disturbance: Secondary | ICD-10-CM | POA: Insufficient documentation

## 2019-03-04 DIAGNOSIS — Z79899 Other long term (current) drug therapy: Secondary | ICD-10-CM | POA: Insufficient documentation

## 2019-03-04 DIAGNOSIS — W01198A Fall on same level from slipping, tripping and stumbling with subsequent striking against other object, initial encounter: Secondary | ICD-10-CM | POA: Diagnosis not present

## 2019-03-04 DIAGNOSIS — I10 Essential (primary) hypertension: Secondary | ICD-10-CM | POA: Diagnosis not present

## 2019-03-04 DIAGNOSIS — Z859 Personal history of malignant neoplasm, unspecified: Secondary | ICD-10-CM | POA: Diagnosis not present

## 2019-03-04 DIAGNOSIS — S0990XA Unspecified injury of head, initial encounter: Secondary | ICD-10-CM

## 2019-03-04 DIAGNOSIS — Y92018 Other place in single-family (private) house as the place of occurrence of the external cause: Secondary | ICD-10-CM | POA: Diagnosis not present

## 2019-03-04 DIAGNOSIS — Z7982 Long term (current) use of aspirin: Secondary | ICD-10-CM | POA: Insufficient documentation

## 2019-03-04 LAB — URINALYSIS, COMPLETE (UACMP) WITH MICROSCOPIC
Bacteria, UA: NONE SEEN
Bilirubin Urine: NEGATIVE
Glucose, UA: NEGATIVE mg/dL
Hgb urine dipstick: NEGATIVE
Ketones, ur: NEGATIVE mg/dL
Leukocytes,Ua: NEGATIVE
Nitrite: NEGATIVE
Protein, ur: NEGATIVE mg/dL
Specific Gravity, Urine: 1.008 (ref 1.005–1.030)
Squamous Epithelial / HPF: NONE SEEN (ref 0–5)
pH: 8 (ref 5.0–8.0)

## 2019-03-04 MED ORDER — LIDOCAINE-PRILOCAINE 2.5-2.5 % EX CREA
TOPICAL_CREAM | Freq: Once | CUTANEOUS | Status: AC
  Administered 2019-03-04: 09:00:00 via TOPICAL
  Filled 2019-03-04: qty 5

## 2019-03-04 NOTE — TOC Transition Note (Signed)
Transition of Care [facility]) - CM/SW Discharge Note   Patient Details  Name: Charles Velasquez MRN: [id number] Date of Birth: Feb 07, 1928  [facility] CM/SW Contact:  Marshell Garfinkel, RN Phone Number: 03/04/2019, 10:57 AM   Clinical Narrative:     Met with patient in attempt to do assessment of home needs post fall. Patient has disorganized speech and unable at answer questions. His children [name] and [name] shared [place]. Patient is DNR per [name] and [name]. Almyra Free will provide transportation home.  Patient and his wife have 24/7 care in the home. [name] also has camera to monitor them. Patient was involved in daycare program at [facility] however since covid restrictions, [name] states his dementia has become worse. His mobility has also become worse leading to fall this morning hitting head. Daughter [name] requests urine to be checked for UTI. He has a walker and elevated commode however dianne is requesting a wheelchair which has been requested from [place] with [employer]. Services already in place at home are PT/RN thought [facility] and Palliative services.  I have sent update to [facility] with [facility] and karen with Palliative. Urine request sent to EDP.  Final next level of care: [place] Barriers to Discharge: No Barriers Identified   Patient Goals and CMS Choice Patient states their goals for this hospitalization and ongoing recovery are:: "return to home with [facility] and palliative services" CMS Medicare.gov Compare Post Acute Care list provided to:: Patient Represenative (must comment)(pt has dementia; son [name] and daughter Shauna Hugh [phone]) Choice offered to / list presented to : Adult Children  Discharge Placement                       Discharge Plan and Services                DME Arranged: Wheelchair manual DME Agency: [facility] Date DME Agency Contacted: 03/04/19 Time DME Agency Contacted:  2778 Representative spoke with at DME Agency: [place]: PT, RN [facility] Agency: [place] ([place]) Date [place]: 03/04/19 Time [place]: 1055 Representative spoke with at [place]: [place] (SDOH) Interventions     Readmission Risk Interventions No flowsheet data found.

## 2019-03-04 NOTE — Discharge Instructions (Addendum)
You have suffered a laceration to your scalp.  Your CT scan is normal.  Your laceration has been repaired with 3 staples that will need to be removed in approximately 10 days.  Please follow-up with your doctor/home health nurse for staple removal.
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
## 2019-03-04 NOTE — ED Notes (Signed)
Pt had urinated all over self and bed. Linens changed. Pt fighting staff and cursing while being changed. New brief and linen placed. Side rails raised and lights dimmed to decrease stimulus.

## 2019-03-04 NOTE — ED Notes (Signed)
Family member at bedside.
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
## 2019-03-04 NOTE — ED Triage Notes (Signed)
Pt arrives via EMS from home after having a fall- pt's home health nurse was trying to get him up and help him walk- pt got combative and fell backwards- per EMS pt has a 1.5 inch lac to the back of his head

## 2019-03-04 NOTE — Telephone Encounter (Signed)
Returned phone call to [facility] at [facility] regarding request for PT and OT. Clarified request with Ashlyn who stated she will follow up.

## 2019-03-04 NOTE — ED Notes (Signed)
Patient transported to CT 

## 2019-03-04 NOTE — ED Notes (Addendum)
Family member at bedside.

## 2019-03-04 NOTE — Care Management (Cosign Needed)
Patient suffers from dementia which impairs their ability to perform daily activities like bathing, mobility, toileting in the home.  A walker will not resolve issue with performing activities of daily living. A wheelchair will allow patient to safely perform daily activities. Patient is not able to propel themselves in the home using a standard weight wheelchair due to dementia. Patient/caregiver can self propel in the lightweight wheelchair. Length of need 99. Accessories: elevating leg rests (ELRs), wheel locks, extensions and anti-tippers, and seat cushion and back cushion.

## 2019-03-05 ENCOUNTER — Other Ambulatory Visit: Payer: Medicare Other | Admitting: Nurse Practitioner

## 2019-03-05 ENCOUNTER — Other Ambulatory Visit: Payer: Self-pay

## 2019-03-05 ENCOUNTER — Encounter: Payer: Self-pay | Admitting: Nurse Practitioner

## 2019-03-05 DIAGNOSIS — Z515 Encounter for palliative care: Secondary | ICD-10-CM

## 2019-03-05 DIAGNOSIS — R2681 Unsteadiness on feet: Secondary | ICD-10-CM
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
## 2019-03-19 ENCOUNTER — Telehealth: Payer: Self-pay

## 2019-03-19 NOTE — Telephone Encounter (Signed)
Received message to call Ashlyn at Dr. Elzie Rings office. Call placed to PCP office. Ashlyn not available. Spoke with [place] who read notes that patient needed staples removed and inquired if this is something palliative care can do. Patient previously had [facility] involved for PT and RN. Will reach out to home health and see if services are still in place. Ashlyn to return call

## 2019-03-30 ENCOUNTER — Encounter: Payer: Self-pay | Admitting: Nurse Practitioner

## 2019-03-30 ENCOUNTER — Other Ambulatory Visit: Payer: Medicare Other | Admitting: Nurse Practitioner

## 2019-03-30 ENCOUNTER — Other Ambulatory Visit: Payer: Self-pay

## 2019-03-30 DIAGNOSIS — Z515 Encounter for palliative care: Secondary | ICD-10-CM

## 2019-03-30 NOTE — Progress Notes (Signed)
[facility] Consult Note Telephone: [phone]  Fax: [phone]  PATIENT NAME: Charles Velasquez DOB: 1928-02-23 MRN: [id number]  PRIMARY CARE PROVIDER:   [name], NP  REFERRING PROVIDER:  [name], NP [id number] [place],  [place] [address]  RESPONSIBLE PARTY:    Rajinder Dobberstein son  I called Mr. [place] home, [place] caregiver reviewed covid questions with negative screening  RECOMMENDATIONS and PLAN: 1. ACP: DNR; continue with goal of care to focus on comfort, keeping at home.  2.Unsteady gait R26.81 secondary todementiaprogression of. Continue fall precautions, high-risk. Encourage therapy for balance training.Recommend continue home PT/OT   3. Agitation, Redirection, working with PT/OT for safety, exercises, continue to use Depakote, Ativan as prescribed. Recommended once maximize Ativan may consider increase Depakote continue to have increase in behaviors, more difficulty with redirection.   4. Palliative care encounter Z51.5; Palliative medicine team will continue to support patient, patient's family, and medical team. Visit consisted of counseling and education dealing with the complex and emotionally intense issues of symptom management and palliative care in the setting of serious and potentially life-threatening illness  I spent 60 minutes providing this consultation,  from 11:30am to 12:30pm. More than 50% of the time in this consultation was spent coordinating communication.   HISTORY OF PRESENT ILLNESS:  [name] is a 85 y.o. year old male with multiple medical problems including Dementia, cancer, hypertension, peripheral vascular disease, carotid artery stenosis. I did a palliative care scheduled home visit for  Mr. Pedone. Delcie Roch his caregiver, daughter Almyra Free were present in addition to [place] by phone. We talked about purpose for palliative We talked about purpose for palliative care  visit for follow-up behaviors, appetite, weakness, decline. Mr. [place] and I talked about symptoms of pain which he denies. We talked about his appetite he shared that he ate breakfast but did not remember what. He was cooperative with assessment. Limited verbal discussion with cognitive impairment. We did look at a family photo album and he was able to identify people by old pictures. Jaci Standard and I talked about his behaviors and occurrences last night. Evidently he became agitated with staff and combative to where he ended up falling with no noted injury. Caregivers called EMS to come assist with getting him back up and he continued to be combative. [name] endorses that EMS recommended placement if behaviors continue. Discussed at length risks and benefits of facility placement. We talked about his behaviors. They did notify primary doctor bad off this morning of incidents that occurred yesterday. Dr. Loney Hering this morning of incidents that occurred yesterday. Dr Loney Hering increase Ativan to 1 mg three times a day as needed for increase in agitation. We did talk about Ativan as a medication side effects vs. Benefits. We talked about fall risk. We talked about attempting Ativan 0.5 in the morning and then afternoon dose 1 mg as recommended then depending on behaviors at bedtime would give either Ativan 0.5mg  or 1mg . We talked about different scenarios, caution for safety. We also talked about if he becomes agitated with family or caregivers to give him time let him decompress by not interacting but observing. We talked about concerns for family, caregiver and safety. We talked about if they do maximize Ativan to 1 mg tid that may consider increasing dose of Depakote for what she currently is on. We talked about different behaviors. We talked about scenarios of what a facility like memory care unit will look  like vs. Home with 24 hour caregivers which are already in place. We talked about different things that can be  put in place for high fall risk and precautions. Family has already done a lot with implementing a safe environment. We talked about PT / OT which has now been started and interacting, working with Mr. Brenes. We talked about some days that he is more willing to participate than others. Some days he will walk and other days he will not. We talked chronic disease progression of dementia and that each day can be different. We talked about utilizing PT/OT to help with assisting Caregivers for safe  interventions when he does become agitated. We talked about coping strategies, caregiver fatigue. My recommendation at present time with intermittent periods of agitation that can be managed at home best plan is to continue home care. We talked about medical goals with focus on comfort and keep sitting at home. We talked about role of palliative care and plan of care. Discuss follow-up visit for palliative care in 2 months if needed or sooner should he declined. Family in agreement and scheduled appointment made. Therapeutic listening and emotional support provided. Praised family for positive outlook and ongoing support for Mr Troutman and caregivers. Questions answered to satisfaction. Contact information. Palliative Care was asked to help address to continue to goals of care.   CODE STATUS: DNR  PPS: 40% HOSPICE ELIGIBILITY/DIAGNOSIS: TBD  PAST MEDICAL HISTORY:  Past Medical History:  Diagnosis Date  . Cancer ([place])   . Dementia ([place])   . Hypertension     SOCIAL HX:  Social History   Tobacco Use  . Smoking status: Never Smoker  . Smokeless tobacco: Never Used  Substance Use Topics  . Alcohol use: Yes    ALLERGIES: No Known Allergies   PERTINENT MEDICATIONS:  Outpatient Encounter Medications as of 03/30/2019  Medication Sig  . amLODipine (NORVASC) 5 MG tablet Take 1 tablet (5 mg total) by mouth daily.  Marland Kitchen aspirin EC 81 MG tablet Take 81 mg by mouth daily.   . cetirizine (ZYRTEC) 10 MG tablet TAKE 1  TABLET BY MOUTH DAILY  . citalopram (CELEXA) 20 MG tablet TAKE 1 TABLET BY MOUTH DAILY  . clopidogrel (PLAVIX) 75 MG tablet Take 75 mg by mouth daily.   Marland Kitchen diltiazem (DILACOR XR) 240 MG 24 hr capsule Take 240 mg by mouth daily.   . divalproex (DEPAKOTE) 250 MG DR tablet TAKE ONE TABLET EVERY MORNING  . fluticasone (FLONASE) 50 MCG/ACT nasal spray Place 2 sprays into both nostrils daily.  Marland Kitchen lisinopril (PRINIVIL,ZESTRIL) 30 MG tablet Take 30 mg by mouth daily.   Marland Kitchen LORazepam (ATIVAN) 0.5 MG tablet Take 1 tablet (0.5 mg total) by mouth at bedtime as needed for anxiety.  Marland Kitchen oseltamivir (TAMIFLU) 75 MG capsule Take 1 capsule (75 mg total) by mouth 2 (two) times daily.   No facility-administered encounter medications on file as of 03/30/2019.     PHYSICAL EXAM:   General: NAD, confused male Cardiovascular: regular rate and rhythm Pulmonary: clear ant fields Abdomen: soft, nontender, + bowel sounds Extremities: mild BLE edema, no joint deformities Neurological: Weakness but otherwise nonfocal/at times non-ambulatory at times; other times he does walk with a walker Kaylani Fromme Ihor Gully, NP

## 2019-06-15 ENCOUNTER — Other Ambulatory Visit: Payer: Self-pay

## 2019-06-15 ENCOUNTER — Other Ambulatory Visit: Payer: Medicare Other | Admitting: Nurse Practitioner

## 2019-06-15 ENCOUNTER — Encounter: Payer: Self-pay | Admitting: Nurse Practitioner

## 2019-06-15 DIAGNOSIS — Z515 Encounter for palliative care: Secondary | ICD-10-CM

## 2019-06-15 NOTE — Progress Notes (Signed)
[facility] Consult Note Telephone: [phone]  Fax: [phone]  PATIENT NAME: Charles Velasquez DOB: 24-May-1928 MRN: [id number]  PRIMARY CARE PROVIDER:   [name], NP  REFERRING PROVIDER:  [name], NP [id number] [place],  [place] [address]  Due to the COVID-19 crisis, this visit was done via telemedicine from my office and it was initiated and consent by this patient and or family.  RESPONSIBLE PARTY:  Charles Velasquez, daughter Due to the COVID-19 crisis, this visit was done via telemedicine from my office and it was initiated and consent by this patient and or family.  RECOMMENDATIONS and PLAN: 1. ACP: DNR; continue with goal of care to focus on comfort, keeping at home. Hospice referral  2. Agitation,  continue to use Depakote, Ativan as prescribed. Recommended once maximize Ativan may consider increase Depakote continue to have increase in behaviors, more difficulty with redirection.   3. Palliative care encounter;Palliative medicine team will continue to support patient, patient's family, and medical team. Visit consisted of counseling and education dealing with the complex and emotionally intense issues of symptom management and palliative care in the setting of serious and potentially life-threatening illness  I spent 40 minutes providing this consultation,  from 3:00pm to 3:40pm. More than 50% of the time in this consultation was spent coordinating communication.   HISTORY OF PRESENT ILLNESS:  Charles Velasquez is a 85 y.o. year old male with multiple medical problems including Dementia, CAD, prostate cancer s/p prostectomy, basal cell cancer, hypertension, peripheral vascular disease, carotid artery stenosis. I called Charles Velasquez,  Charles Velasquez caregiver as he has 24-hour caregivers in the home with him. We talked about palliative care visit was scheduled for in person but discussed that will change to tell a  medicine do to covid. We talked about how Charles Velasquez has been doing. Charles Velasquez endorses Charles Velasquez has progressed to the point where he is not walking. It is very difficult to transfer him. Caregivers are often having to call more people to come help get him up or transfer him. Charles Velasquez endorses Charles Velasquez continues to be total ADL care. He is incontinent bowel and bladder. He is not able to recognize when he has had a bowel movement or urinated on himself as he will sit in a wet diaper. He does not appear to be in pain or shortness of breath. No recent wounds, hospitalizations, infections. He does fall frequently. Cognitively Charles Velasquez speech continues to say clear words and sometimes a few words together. He is having more difficulty with memory. We talked about is appetite which is good with no weight loss. Charles Velasquez endorses that they do now feed him as it takes so long time more than 30 minutes to eat. Charles Velasquez talked about [name] continues to read. Charles Velasquez endorses he continues to overall decline in the setting of dementia. She is seen a significant change over the last six months where he was walking at that time and not now. At that time he was able to do some ADL how much oil it and feed himself. He has progressed to Total ADL dependence, incontinence and now does require to be fed. We talked about his behavior which is worsening. We talked about his combative missed and agitation. We talked about different strategies they are using for redirection. I attempted to contact Charles Velasquez, [name] daughter's phone number for further discussion and update on palliative care visit. I called Charles Velasquez and left  a message to return call. I also presented his case to hospice physician for possible eligibility and if they find he is eligible will further discuss with Charles Velasquez as an option.  I called Charles Velasquez for follow up call discussion about palliative care visit. We talked about update received from Charles Velasquez, Charles Velasquez caregiver. We talked  about progression of chronic disease in the setting of dementia. We talked about medical goals to focus on Comfort. We talked about the option of Hospice Services as review by hospice Physicians to be eligible. We talked about services that would be provided. Charles Velasquez endorses her wishes are to proceed with hospice referral. Talk about role of palliative care and plan of care. Discussed will contact Dr Tora Kindred office for hospice order. We talked about caregiver fatigue, challenges and coping strategies. Therapeutic listening and emotional support provided. Contact information provided. Questions answered satisfaction  Palliative Care was asked to help to continue to address goals of care.   CODE STATUS: DNR  PPS: 40% HOSPICE ELIGIBILITY/DIAGNOSIS: possible with clinical presentation  PAST MEDICAL HISTORY:  Past Medical History:  Diagnosis Date  . Cancer ([place])   . Dementia ([place])   . Hypertension     SOCIAL HX:  Social History   Tobacco Use  . Smoking status: Never Smoker  . Smokeless tobacco: Never Used  Substance Use Topics  . Alcohol use: Yes    ALLERGIES: No Known Allergies   PERTINENT MEDICATIONS:  Outpatient Encounter Medications as of 06/15/2019  Medication Sig  . amLODipine (NORVASC) 5 MG tablet Take 1 tablet (5 mg total) by mouth daily.  Marland Kitchen aspirin EC 81 MG tablet Take 81 mg by mouth daily.   . cetirizine (ZYRTEC) 10 MG tablet TAKE 1 TABLET BY MOUTH DAILY  . citalopram (CELEXA) 20 MG tablet TAKE 1 TABLET BY MOUTH DAILY  . clopidogrel (PLAVIX) 75 MG tablet Take 75 mg by mouth daily.   Marland Kitchen diltiazem (DILACOR XR) 240 MG 24 hr capsule Take 240 mg by mouth daily.   . divalproex (DEPAKOTE) 250 MG DR tablet TAKE ONE TABLET EVERY MORNING  . fluticasone (FLONASE) 50 MCG/ACT nasal spray Place 2 sprays into both nostrils daily.  Marland Kitchen lisinopril (PRINIVIL,ZESTRIL) 30 MG tablet Take 30 mg by mouth daily.   Marland Kitchen LORazepam (ATIVAN) 0.5 MG tablet Take 1 tablet (0.5 mg total) by mouth at bedtime as  needed for anxiety.  Marland Kitchen oseltamivir (TAMIFLU) 75 MG capsule Take 1 capsule (75 mg total) by mouth 2 (two) times daily.   No facility-administered encounter medications on file as of 06/15/2019.     PHYSICAL EXAM:   Deferred  Christin Z Gusler, NP

## 2019-10-12 IMAGING — CT CT HEAD WITHOUT CONTRAST
4 of 8 series · 16 of 47 positions shown, 18 images · non-contrast
Comparison: None.

CLINICAL DATA: Pt arrives via EMS from home after having a fall.
Pt's home health nurse was trying to get him up and help him walk.

EXAM:
CT HEAD WITHOUT CONTRAST
TECHNIQUE: Contiguous axial images were obtained from the base of the skull
through the vertex without intravenous contrast.

[Series 4: coronal soft tissue · coronal · 0.33mm/px · 3 of 65 slices shown]
[im 17/65  brain]
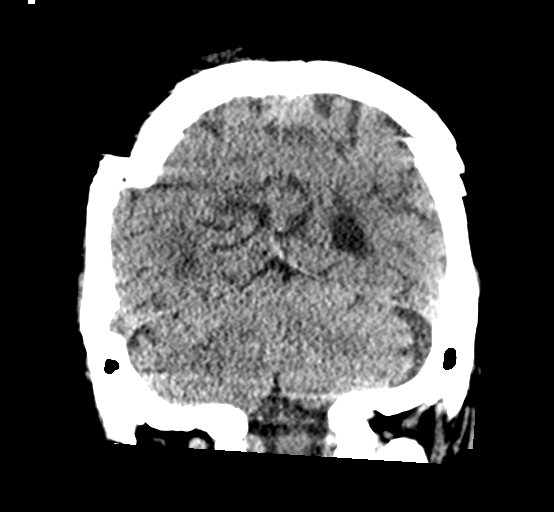
[im 33/65  brain]
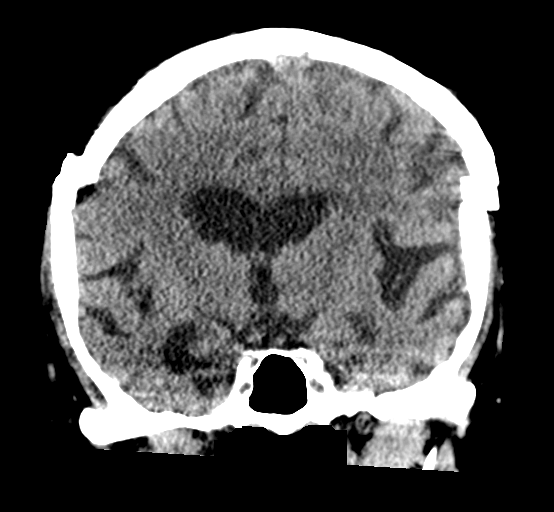
[im 49/65  brain]
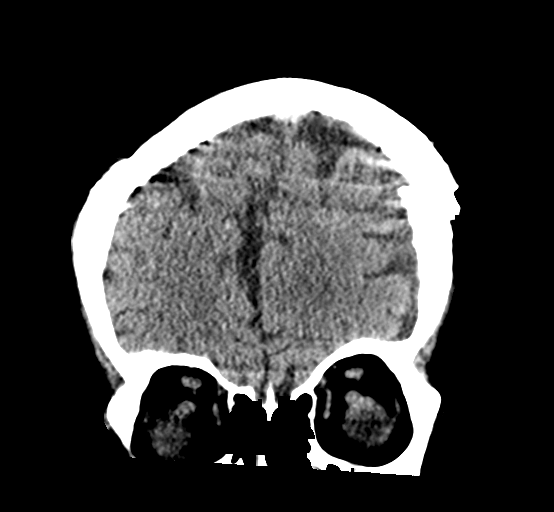

[Series 5: sagittal soft tissue · sagittal · 0.33mm/px · 2 of 59 slices shown]
[im 20/59  brain]
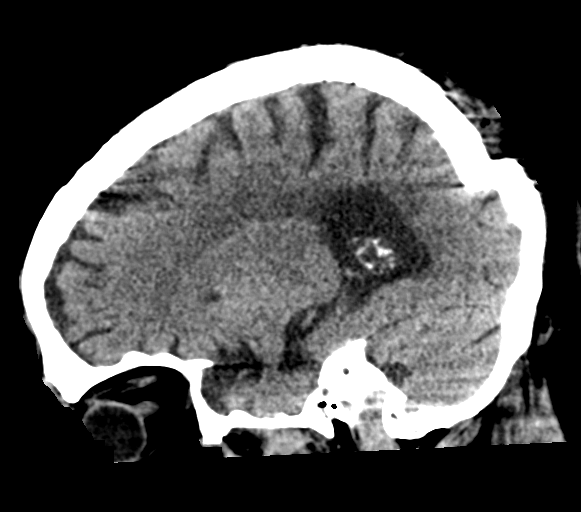
[im 39/59  brain]
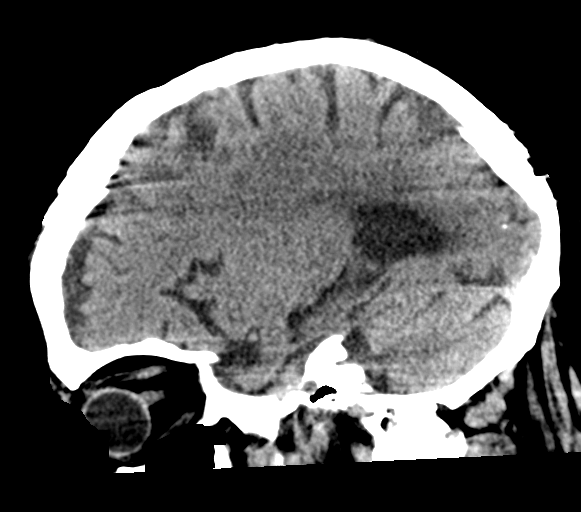

[Series 6: ax head wo 2 · axial · 0.36mm/px · z∈[-126,-8]mm · 7 of 33 slices shown, 9 images]
[im 5/33  brain]
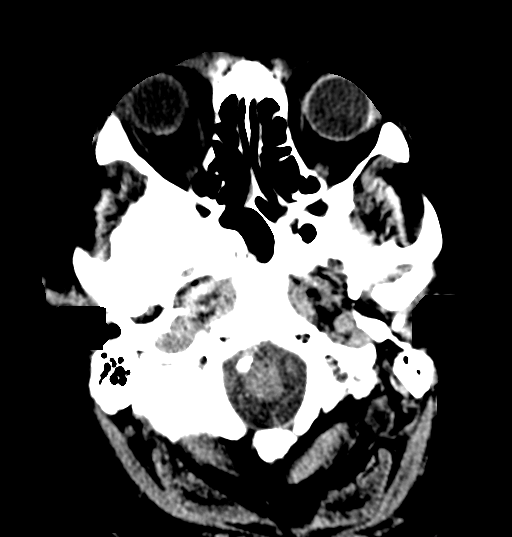
[im 5/33  bone]
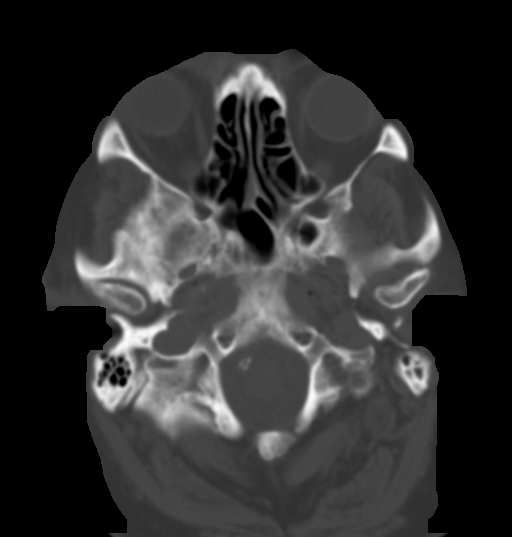
[im 9/33  brain]
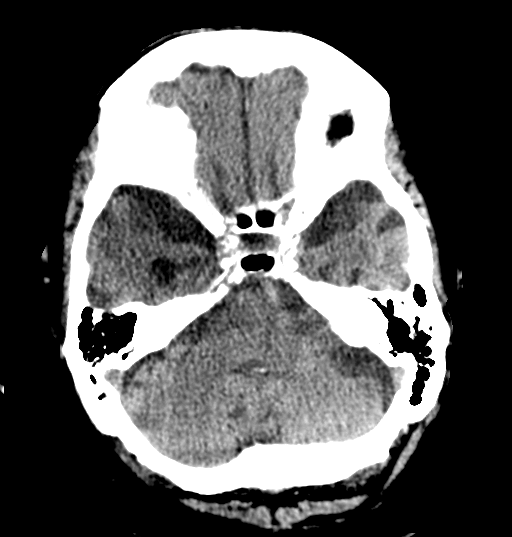
[im 13/33  brain]
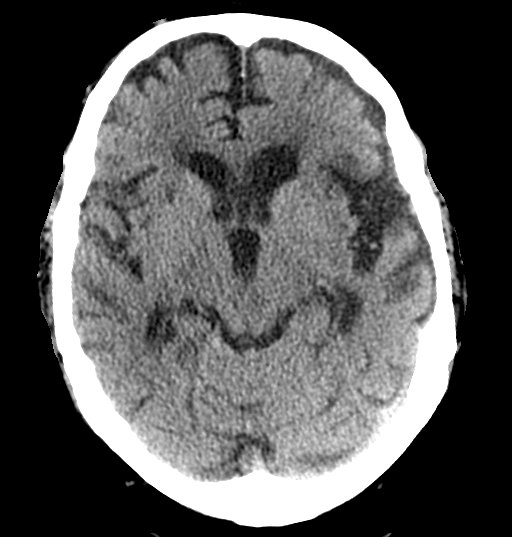
[im 17/33  brain]
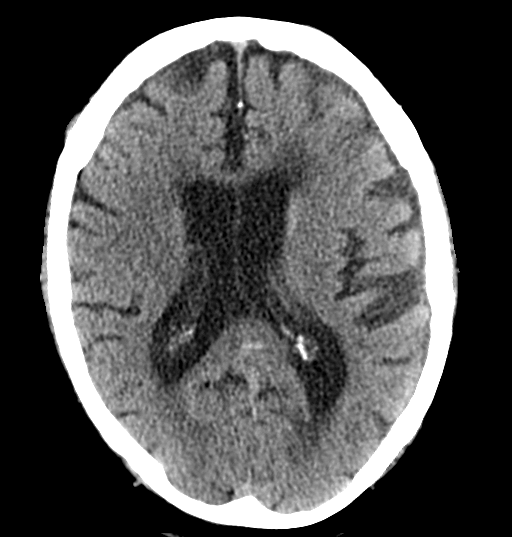
[im 21/33  brain]
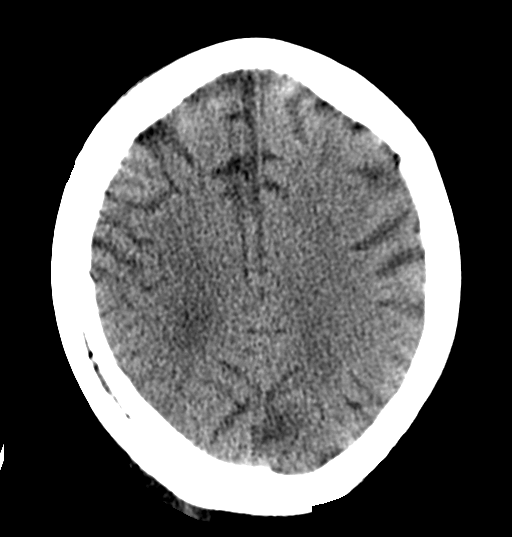
[im 21/33  bone]
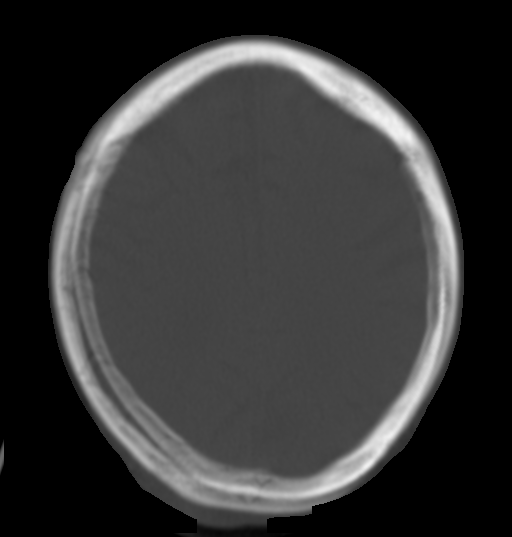
[im 25/33  brain]
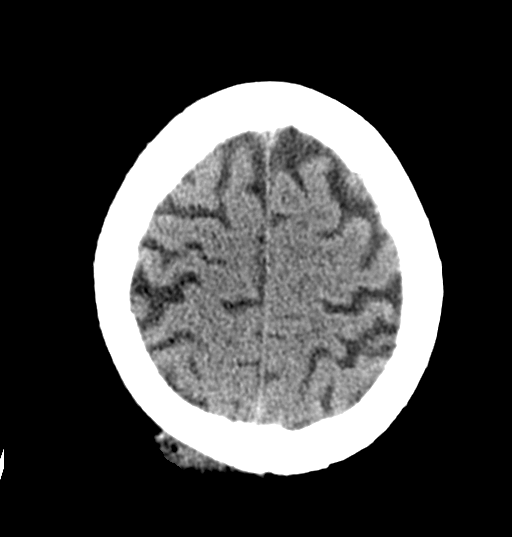
[im 29/33  brain]
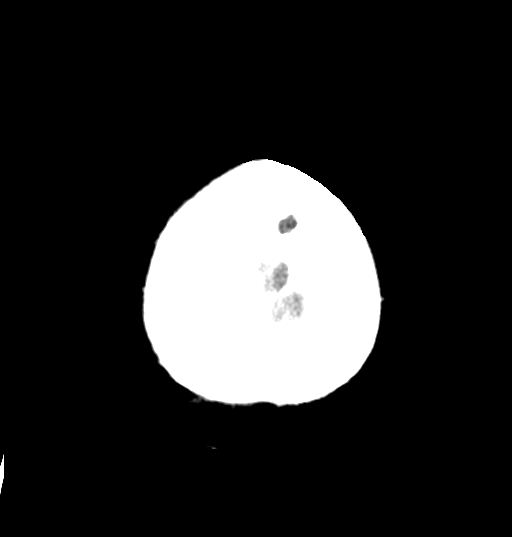

[Series 8: ax head wo 3 · axial · 0.36mm/px · z∈[-129,-70]mm · 4 of 33 slices shown]
[im 5/33  brain]
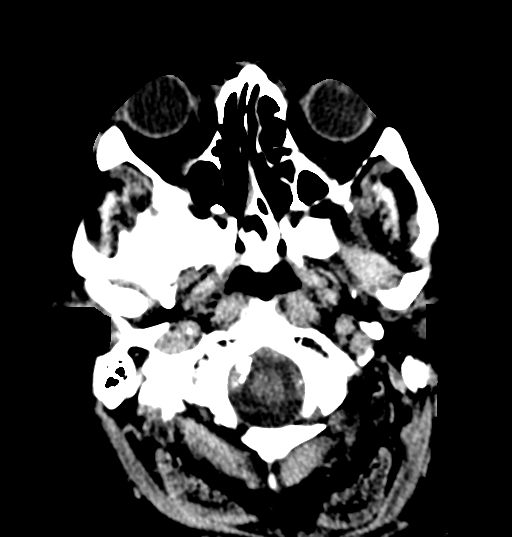
[im 9/33  brain]
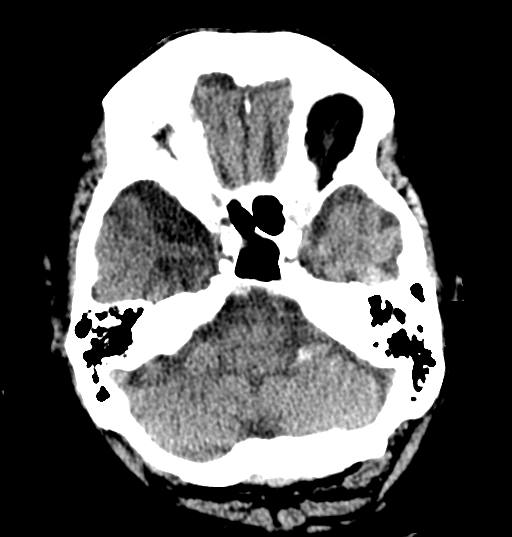
[im 13/33  brain]
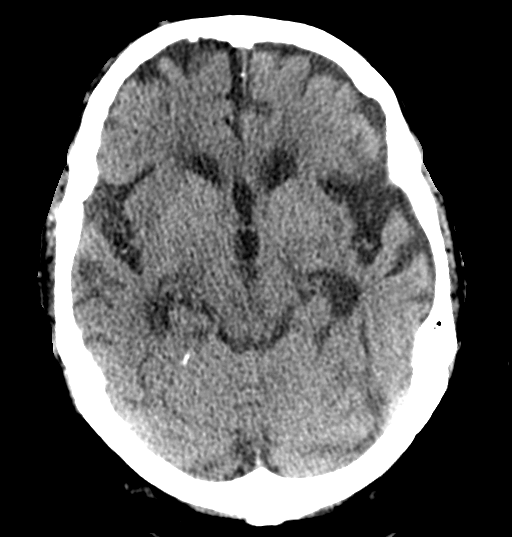
[im 17/33  brain]
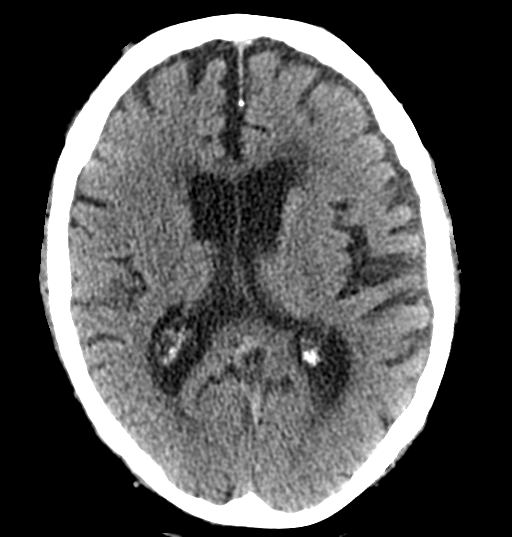

[16 of 47 positions shown; findings below may reference images not displayed]

FINDINGS: Brain: No evidence of acute infarction, hemorrhage, extra-axial
collection, ventriculomegaly, or mass effect. Right parietal scalp
laceration. Old right parietal infarct. Generalized cerebral
atrophy. Periventricular white matter low attenuation likely
secondary to microangiopathy.

Vascular: Cerebrovascular atherosclerotic calcifications are noted.

Skull: Negative for fracture or focal lesion.

Sinuses/Orbits: Visualized portions of the orbits are unremarkable.
Visualized portions of the paranasal sinuses are unremarkable.
Visualized portions of the mastoid air cells are unremarkable.

Other: None.
IMPRESSION: 1. No acute intracranial pathology.
2. Right parietal scalp laceration.

## 2019-11-12 ENCOUNTER — Other Ambulatory Visit: Payer: Self-pay

## 2019-11-12 ENCOUNTER — Emergency Department
Admission: EM | Admit: 2019-11-12 | Discharge: 2019-11-12 | Disposition: A | Discharge status: Home / Self Care | Attending: Emergency Medicine | Admitting: Emergency Medicine

## 2019-11-12 DIAGNOSIS — I10 Essential (primary) hypertension: Secondary | ICD-10-CM | POA: Insufficient documentation

## 2019-11-12 DIAGNOSIS — R34 Anuria and oliguria: Secondary | ICD-10-CM | POA: Diagnosis not present

## 2019-11-12 DIAGNOSIS — F0390 Unspecified dementia without behavioral disturbance: Secondary | ICD-10-CM | POA: Diagnosis not present

## 2019-11-12 DIAGNOSIS — Z7982 Long term (current) use of aspirin: Secondary | ICD-10-CM | POA: Diagnosis not present

## 2019-11-12 DIAGNOSIS — Z79899 Other long term (current) drug therapy: Secondary | ICD-10-CM | POA: Insufficient documentation

## 2019-11-12 LAB — URINALYSIS, COMPLETE (UACMP) WITH MICROSCOPIC
Bilirubin Urine: NEGATIVE
Glucose, UA: NEGATIVE mg/dL
Ketones, ur: NEGATIVE mg/dL
Leukocytes,Ua: NEGATIVE
Nitrite: NEGATIVE
Protein, ur: 100 mg/dL — AB
Specific Gravity, Urine: 1.021 (ref 1.005–1.030)
Squamous Epithelial / HPF: NONE SEEN (ref 0–5)
pH: 5 (ref 5.0–8.0)

## 2019-11-12 LAB — CBC WITH DIFFERENTIAL/PLATELET
Abs Immature Granulocytes: 0.02 10*3/uL (ref 0.00–0.07)
Basophils Absolute: 0.1 10*3/uL (ref 0.0–0.1)
Basophils Relative: 1 %
Eosinophils Absolute: 0.2 10*3/uL (ref 0.0–0.5)
Eosinophils Relative: 2 %
HCT: 42.2 % (ref 39.0–52.0)
Hemoglobin: 13.9 g/dL (ref 13.0–17.0)
Immature Granulocytes: 0 %
Lymphocytes Relative: 39 %
Lymphs Abs: 3.3 10*3/uL (ref 0.7–4.0)
MCH: 30.2 pg (ref 26.0–34.0)
MCHC: 32.9 g/dL (ref 30.0–36.0)
MCV: 91.5 fL (ref 80.0–100.0)
Monocytes Absolute: 0.7 10*3/uL (ref 0.1–1.0)
Monocytes Relative: 8 %
Neutro Abs: 4.1 10*3/uL (ref 1.7–7.7)
Neutrophils Relative %: 50 %
Platelets: 334 10*3/uL (ref 150–400)
RBC: 4.61 MIL/uL (ref 4.22–5.81)
RDW: 14.4 % (ref 11.5–15.5)
WBC: 8.4 10*3/uL (ref 4.0–10.5)
nRBC: 0 % (ref 0.0–0.2)

## 2019-11-12 LAB — BASIC METABOLIC PANEL
Anion gap: 6 (ref 5–15)
BUN: 16 mg/dL (ref 8–23)
CO2: 28 mmol/L (ref 22–32)
Calcium: 9.3 mg/dL (ref 8.9–10.3)
Chloride: 103 mmol/L (ref 98–111)
Creatinine, Ser: 0.57 mg/dL — ABNORMAL LOW (ref 0.61–1.24)
GFR calc Af Amer: >60 mL/min (ref >60)
GFR calc non Af Amer: >60 mL/min (ref >60)
Glucose, Bld: 116 mg/dL — ABNORMAL HIGH (ref 70–99)
Potassium: 4.3 mmol/L (ref 3.5–5.1)
Sodium: 137 mmol/L (ref 135–145)

## 2019-11-12 NOTE — Discharge Instructions (Addendum)
Patient had a Foley placed and had 220 cc of urine.  Is unclear if he has retention or just not been making a lot of urine.  We discussed the pros and cons of taking the Foley out versus leaving it in.  At this time we have elected to leave it in for you guys so you can monitor his urine output.  If he continues to urinate well over the next few days you could do a trial with the Foley catheter is removed and see if he can continue to urinate on his own.  Return to the ER for any other concerns.

## 2019-11-12 NOTE — ED Triage Notes (Signed)
Pt arrives via EMS from home- on home hospice- pt has not had any urine output since last night around 10:30- hospice nurse came and did an in and out cath and got "very little" urine and was requesting a bladder scan and second in and out cath- pt has hx of severe dementia

## 2019-11-12 NOTE — ED Provider Notes (Signed)
[facility] Emergency Department Provider Note  ____________________________________________   First MD Initiated Contact with Patient 11/12/19 1252     (approximate)  I have reviewed the triage vital signs and the nursing notes.   HISTORY  Chief Complaint Decreased Urine Output    HPI Charles Velasquez is a 86 y.o. male with dementia, prior prostate cancer who is currently under hospice care who comes in for concerns for no urine output.  I did call the hospice nurse to get collateral information and patient has not urinated for 12 hours.  Normally he can urinate on his own.  She tried to straight cath him and had no urine out.  Patient's son is at bedside who is the POA.  He states that this is something new that he is never had happened before.  Unable to get full HPI due to patient's baseline dementia          Past Medical History:  Diagnosis Date  . Cancer ([place])   . Dementia ([place])   . Hypertension     Patient Active Problem List   Diagnosis Date Noted  . Unsteady gait 02/26/2019  . Palliative care encounter 02/26/2019  . CAP (community acquired pneumonia) 11/08/2017  . Hypertension 03/05/2017  . Carotid stenosis 03/05/2017  . PAD (peripheral artery disease) ([place]) 03/05/2017    No past surgical history on file.  Prior to Admission medications   Medication Sig Start Date End Date Taking? Authorizing Provider  amLODipine (NORVASC) 5 MG tablet Take 1 tablet (5 mg total) by mouth daily. 11/27/17 11/27/18  Rudene Re, MD  aspirin EC 81 MG tablet Take 81 mg by mouth daily.     [provider]  cetirizine (ZYRTEC) 10 MG tablet TAKE 1 TABLET BY MOUTH DAILY 01/18/17   [provider]  citalopram (CELEXA) 20 MG tablet TAKE 1 TABLET BY MOUTH DAILY 08/27/16   [provider]  clopidogrel (PLAVIX) 75 MG tablet Take 75 mg by mouth daily.     [provider]  diltiazem (DILACOR XR) 240 MG 24 hr capsule Take  240 mg by mouth daily.  07/11/10   [provider]  divalproex (DEPAKOTE) 250 MG DR tablet TAKE ONE TABLET EVERY MORNING 07/14/18   [provider]  fluticasone (FLONASE) 50 MCG/ACT nasal spray Place 2 sprays into both nostrils daily.    [provider]  lisinopril (PRINIVIL,ZESTRIL) 30 MG tablet Take 30 mg by mouth daily.     [provider]  oseltamivir (TAMIFLU) 75 MG capsule Take 1 capsule (75 mg total) by mouth 2 (two) times daily. 07/22/18   Hillary Bow, MD    Allergies Patient has no known allergies.  No family history on file.  Social History Social History   Tobacco Use  . Smoking status: Never Smoker  . Smokeless tobacco: Never Used  Substance Use Topics  . Alcohol use: Yes  . Drug use: Never      Review of Systems Unable to get full review of systems due to patient's dementia  ____________________________________________   PHYSICAL EXAM:  VITAL SIGNS: Pulse (!) 52, temperature 97.6 F (36.4 C), temperature source Axillary, resp. rate 16, height 6' (1.829 m), weight 81.6 kg, SpO2 96 %.   Constitutional: Patient is looking around the room but has been nonverbal with me Eyes: Conjunctivae are normal. Head: Atraumatic. Nose: No congestion/rhinnorhea. Mouth/Throat: Mucous membranes are moist.   Neck: No stridor. Trachea Midline. FROM Cardiovascular: Normal rate, regular rhythm. Grossly normal  heart sounds.  Good peripheral circulation. Respiratory: Normal respiratory effort.  No retractions. Lungs CTAB. Gastrointestinal: Soft and nontender. No distention. No abdominal bruits.  Musculoskeletal: No lower extremity tenderness nor edema.  No joint effusions. Neurologic: Patient has baseline dementia so is bedbound at baseline.  Son at bedside states that he is at baseline. Skin:  Skin is warm, dry and intact.  Psychiatric: Unable to fully assess due to patient's dementia GU: Deferred    ____________________________________________   LABS (all labs ordered are listed, but only abnormal results are displayed)  Labs Reviewed  URINALYSIS, COMPLETE (UACMP) WITH MICROSCOPIC - Abnormal; Notable for the following components:      Result Value   Color, Urine AMBER (*)    APPearance CLOUDY (*)    Hgb urine dipstick MODERATE (*)    Protein, ur 100 (*)    Bacteria, UA RARE (*)    All other components within normal limits  URINE CULTURE  CBC WITH DIFFERENTIAL/PLATELET  BASIC METABOLIC PANEL   _____   INITIAL IMPRESSION / ASSESSMENT AND PLAN / ED COURSE  Charles Velasquez was evaluated in Emergency Department on 11/12/2019 for the symptoms described in the history of present illness. He was evaluated in the context of the global COVID-19 pandemic, which necessitated consideration that the patient might be at risk for infection with the SARS-CoV-2 virus that causes COVID-19. Institutional protocols and algorithms that pertain to the evaluation of patients at risk for COVID-19 are in a state of rapid change based on information released by regulatory bodies including the CDC and federal and state organizations. These policies and algorithms were followed during the patient's care in the ED.    Patient is an 86 year old gentleman on hospice care who comes in with no urine output for 12 hours.  Discussed with son who is at bedside that this could be secondary to obstruction such as stricture and that we could place a Foley to see if has urine but just not coming out.  It is not a ton of fluid coming out but it could also be a sign that he could be in renal failure and this could be part of the process of him dying.  Hospice liaison was also at bedside.  We discussed getting labs and giving fluid and at this time he like to hold off and see how much urine comes out of the Foley.  The also noted to have a DNR that was not signed and have requested that I fill out a new DNR form.  Son is the  POA and states that he is under hospice care and he would not want chest compressions or intubation.  Foley was placed and about 220 mL of urine came out.  This is not a significant net of urine over the past 12 hours.  Discussed with family they are okay with getting some lab work.  Urine shows some blood in it but patient did have a Foley placed so that could be from not.  Does have some calcium oxalate crystals but not having any pain to suggest kidney stone.  Patient has 11-20 WBCs.  We will hold off on CT scan given no pain or evidence of septic stone.  Patient has no white count to suggest infection, no fever to suggest infection.  Will send for culture but will hold off on antibiotics at this time after discussion with family.  Labs are reassuring no evidence of kidney dysfunction or electrolyte abnormalities.  Discussed with family  about the pros and cons of leaving the Foley in.  It may be beneficial for them to monitor his urine output for the next 24 to 48 hours just because there was a difficult time getting the Foley in that if he was not urinating and they would try to straight cath and again they may have difficulty getting it in.  However he really did not have a ton of urine in there to suggest him having urinary retention.  There is also the risk of infection.  At this time family elected to want to leave the Foley in so the hospice team can further monitor and then if his urinating is going well they will trial removing the Foley.  At this time family feels comfortable with patient going back home.  I discussed the provisional nature of ED diagnosis, the treatment so far, the ongoing plan of care, follow up appointments and return precautions with the patient and any family or support people present. They expressed understanding and agreed with the plan, discharged home.    ____________________________________________   FINAL CLINICAL IMPRESSION(S) / ED DIAGNOSES   Final  diagnoses:  Decreased urine output      MEDICATIONS GIVEN DURING THIS VISIT:  Medications - No data to display   ED Discharge Orders    None       Note:  This document was prepared using Dragon voice recognition software and may include unintentional dictation errors.   Vanessa [place], MD 11/12/19 [phone]

## 2019-11-12 NOTE — ED Notes (Signed)
Bladder scan showed >222ml

## 2019-11-12 NOTE — ED Notes (Signed)
Son signed for discharge d/t pt having dementia

## 2019-11-12 NOTE — ED Notes (Signed)
Son at bedside.

## 2019-11-14 LAB — URINE CULTURE: Culture: NO GROWTH

## 2020-04-22 ENCOUNTER — Telehealth: Payer: Self-pay | Admitting: Nurse Practitioner

## 2020-04-22 NOTE — Telephone Encounter (Signed)
Spoke with daughter, Luanna Cole, regarding Palliative services and we have scheduled an In-person Consult for 04/26/20 @ 11 AM.

## 2020-04-26 ENCOUNTER — Encounter: Payer: Self-pay | Admitting: Nurse Practitioner

## 2020-04-26 ENCOUNTER — Other Ambulatory Visit: Payer: Self-pay

## 2020-04-26 ENCOUNTER — Other Ambulatory Visit: Payer: Medicare Other | Admitting: Nurse Practitioner

## 2020-04-26 DIAGNOSIS — Z515 Encounter for palliative care: Secondary | ICD-10-CM

## 2020-04-26 DIAGNOSIS — F02818 Dementia in other diseases classified elsewhere, unspecified severity, with other behavioral disturbance: Secondary | ICD-10-CM

## 2020-04-26 NOTE — Progress Notes (Signed)
[facility] Consult Note Telephone: [phone]  Fax: [phone]  PATIENT NAME: Charles Velasquez DOB: [birth year] MRN: [id number]  PRIMARY CARE PROVIDER:   [name], NP  REFERRING PROVIDER:  [name], NP [address] [place],  [place] [address]  RESPONSIBLE PARTY:   Luanna Cole, daughter  RECOMMENDATIONS and PLAN: 1. ACP: DNR; continue with goal of care to focus on comfort, keeping at home. Hospice referral  2. Palliative care encounter;Palliative medicine team will continue to support patient, patient's family, and medical team. Visit consisted of counseling and education dealing with the complex and emotionally intense issues of symptom management and palliative care in the setting of serious and potentially life-threatening illness  I spent 90 minutes providing this consultation,  from 10:30am to 12:00pm. More than 50% of the time in this consultation was spent coordinating communication.   HISTORY OF PRESENT ILLNESS:  Charles Velasquez is a 86 y.o. year old male with multiple medical problems including Dementia, CAD, prostate cancer s/p prostectomy, basal cell cancer, hypertension, peripheral vascular disease, carotid artery stenosis.   8 inches right arm 14.5 inches right leg  In-person follow up palliative care visit since discharge from hospice Services. Last palliative care visit was 94 /  2020 when transition to [facility]. I called Charles Velasquez, Charles Velasquez daughter prior to visit as she was not going to be present. Charles Velasquez endorses that Charles Velasquez was let go from hospice, he was doing better but it seems like he is more tired lately. Discussed with Charles Velasquez will contact after palliative care visit. I visited an observed Charles Velasquez. At last palliative care visit about 1 year ago Mr Velasquez was able to sit up in a wheelchair propel with his feet so distances. Charles Velasquez was falling a lot at that time. Charles Velasquez was  able to feed himself. At present time Charles Velasquez has been found, requires caregivers to turn in position him. Charles Velasquez is total ADL care with incontinence. Caregiver endorses that she is having to feed him as he does an occasional finger food like a sandwich but it is difficult for him to get it to his mouth. Caregiver endorses Mr. Charles Velasquez did eat one pancake today but it does take him some time to eat. Caregiver endorses some days are better than others. Charles Velasquez developed a cough about a week ago and it is worsening. We talked about cognition, lethargy as at present time Charles Velasquez appears ill. Caregiver endorses some days he is brighter than other days. We talked about sleeping about 6 out of 8 hours of her shift and typically that is for the other caregivers as well which is about 18 hours a day. We talked about no fever noted but caregiver endorses the cough seems to be worse than anything. Discuss will send in antibiotic for what appears to be pneumonia with clinical presentation rhonchi throughout. Doxycycline 100 mg b i d x 10 days, will revisit Hospice eligibility as with new acute illness and what appears to be an overall decline. Discussed with caregiver and call Charles Velasquez to update on plan of care. Charles Velasquez in agreement. I review the case with Hospice Physicians to see about revisiting eligibility. Contacted Charles Bandy RN Case manager  for Hospice for further input and measurements. Charles Velasquez in agreement. Discussed will contact Charles Velasquez once it is determined should revisit eligibility for continue with palliative care. Charles Velasquez in agreement. Questions answered to satisfaction. Therapeutic listening and  emotional support provided. Charles Velasquez did ask about covid and we talked about a rapid test you can purchase in the drugstore or can be from primary provider though palliative care does not provide covid testing. Charles Velasquez asked if that was an option through hospice if they did come to do a visit and will inquire. Hospice  Physicians in agreement to have AV visit and will call [name] office for Hospice order. I called Charles Velasquez and update given wishes are to proceed with Hospice.  Palliative Care was asked to help to continue to address goals of care.   CODE STATUS:   PPS: 0% HOSPICE ELIGIBILITY/DIAGNOSIS: TBD  PAST MEDICAL HISTORY:  Past Medical History:  Diagnosis Date  . Cancer ([place])   . Dementia ([place])   . Hypertension     SOCIAL HX:  Social History   Tobacco Use  . Smoking status: Never Smoker  . Smokeless tobacco: Never Used  Substance Use Topics  . Alcohol use: Yes    ALLERGIES: No Known Allergies   PERTINENT MEDICATIONS:  Outpatient Encounter Medications as of 04/26/2020  Medication Sig  . amLODipine (NORVASC) 5 MG tablet Take 1 tablet (5 mg total) by mouth daily.  Marland Kitchen aspirin EC 81 MG tablet Take 81 mg by mouth daily.   . cetirizine (ZYRTEC) 10 MG tablet TAKE 1 TABLET BY MOUTH DAILY  . citalopram (CELEXA) 20 MG tablet TAKE 1 TABLET BY MOUTH DAILY  . clopidogrel (PLAVIX) 75 MG tablet Take 75 mg by mouth daily.   Marland Kitchen diltiazem (DILACOR XR) 240 MG 24 hr capsule Take 240 mg by mouth daily.   . divalproex (DEPAKOTE) 250 MG DR tablet TAKE ONE TABLET EVERY MORNING  . fluticasone (FLONASE) 50 MCG/ACT nasal spray Place 2 sprays into both nostrils daily.  Marland Kitchen lisinopril (PRINIVIL,ZESTRIL) 30 MG tablet Take 30 mg by mouth daily.   Marland Kitchen oseltamivir (TAMIFLU) 75 MG capsule Take 1 capsule (75 mg total) by mouth 2 (two) times daily.   No facility-administered encounter medications on file as of 04/26/2020.    PHYSICAL EXAM:   General: debilitated, chronically ill, frail appearing, thin male Cardiovascular: regular rate and rhythm Pulmonary: rhonchi; poor air movements Neurological: functional quadriplegic  Clide Remmers Ihor Gully, NP

## 2020-09-06 DEATH — deceased
